# Patient Record
Sex: Female | Born: 1950 | Race: Black or African American | Hispanic: No | Smoking: Current every day smoker
Health system: Southern US, Community
[De-identification: ages and names within clinical notes are randomized; demographics above are authoritative.]

## PROBLEM LIST (undated history)

## (undated) DIAGNOSIS — K219 Gastro-esophageal reflux disease without esophagitis: Secondary | ICD-10-CM

## (undated) DIAGNOSIS — E785 Hyperlipidemia, unspecified: Secondary | ICD-10-CM

## (undated) DIAGNOSIS — Z720 Tobacco use: Secondary | ICD-10-CM

## (undated) DIAGNOSIS — F419 Anxiety disorder, unspecified: Secondary | ICD-10-CM

## (undated) DIAGNOSIS — H579 Unspecified disorder of eye and adnexa: Secondary | ICD-10-CM

## (undated) DIAGNOSIS — J449 Chronic obstructive pulmonary disease, unspecified: Secondary | ICD-10-CM

## (undated) DIAGNOSIS — J45909 Unspecified asthma, uncomplicated: Secondary | ICD-10-CM

## (undated) DIAGNOSIS — M1990 Unspecified osteoarthritis, unspecified site: Secondary | ICD-10-CM

## (undated) DIAGNOSIS — I10 Essential (primary) hypertension: Secondary | ICD-10-CM

## (undated) HISTORY — PX: CARPAL TUNNEL RELEASE: SHX101

## (undated) HISTORY — PX: ABDOMINAL HYSTERECTOMY: SHX81

---

## 2004-02-10 ENCOUNTER — Ambulatory Visit: Payer: Self-pay | Admitting: Family Medicine

## 2004-03-24 ENCOUNTER — Emergency Department: Payer: Self-pay | Admitting: General Practice

## 2005-12-06 ENCOUNTER — Emergency Department: Payer: Self-pay | Admitting: Emergency Medicine

## 2006-05-03 ENCOUNTER — Emergency Department: Payer: Self-pay | Admitting: Internal Medicine

## 2008-11-04 ENCOUNTER — Ambulatory Visit: Payer: Self-pay | Admitting: Orthopedic Surgery

## 2008-11-10 ENCOUNTER — Ambulatory Visit: Payer: Self-pay | Admitting: Orthopedic Surgery

## 2013-12-07 ENCOUNTER — Encounter: Payer: Self-pay | Admitting: Internal Medicine

## 2013-12-21 ENCOUNTER — Encounter: Payer: Self-pay | Admitting: Internal Medicine

## 2014-12-17 ENCOUNTER — Emergency Department
Admission: EM | Admit: 2014-12-17 | Discharge: 2014-12-17 | Disposition: A | Discharge status: Home / Self Care | Payer: Medicare HMO | Attending: Emergency Medicine | Admitting: Emergency Medicine

## 2014-12-17 DIAGNOSIS — R05 Cough: Secondary | ICD-10-CM | POA: Diagnosis present

## 2014-12-17 DIAGNOSIS — F17210 Nicotine dependence, cigarettes, uncomplicated: Secondary | ICD-10-CM | POA: Insufficient documentation

## 2014-12-17 DIAGNOSIS — I10 Essential (primary) hypertension: Secondary | ICD-10-CM | POA: Insufficient documentation

## 2014-12-17 DIAGNOSIS — J209 Acute bronchitis, unspecified: Secondary | ICD-10-CM | POA: Diagnosis not present

## 2014-12-17 DIAGNOSIS — J40 Bronchitis, not specified as acute or chronic: Secondary | ICD-10-CM

## 2014-12-17 HISTORY — DX: Gastro-esophageal reflux disease without esophagitis: K21.9

## 2014-12-17 HISTORY — DX: Unspecified disorder of eye and adnexa: H57.9

## 2014-12-17 HISTORY — DX: Unspecified osteoarthritis, unspecified site: M19.90

## 2014-12-17 MED ORDER — PREDNISONE 10 MG PO TABS: 10.0000 mg | ORAL_TABLET | ORAL | Status: DC

## 2014-12-17 MED ORDER — ALBUTEROL SULFATE HFA 108 (90 BASE) MCG/ACT IN AERS: 2.0000 | INHALATION_SPRAY | RESPIRATORY_TRACT | Status: DC | PRN

## 2014-12-17 MED ORDER — BENZONATATE 200 MG PO CAPS: 200.0000 mg | ORAL_CAPSULE | Freq: Three times a day (TID) | ORAL | Status: DC | PRN

## 2014-12-17 MED ORDER — AZITHROMYCIN 250 MG PO TABS: ORAL_TABLET | ORAL | Status: DC

## 2014-12-17 NOTE — ED Notes (Signed)
Pt with cough onset a week ago, progressively worse. Last 2 nights with minimal sleep d/t cough. Congested but non-productive. Soreness to chest when coughing

## 2014-12-17 NOTE — ED Provider Notes (Signed)
[facility] Emergency Department Provider Note  ____________________________________________  Time seen: Approximately 3:26 PM  I have reviewed the triage vital signs and the nursing notes.   HISTORY  Chief Complaint Cough    HPI Deborah Montgomery is a 64 y.o. female presents emergency department complaining of a cough for week. She states that initially she had some minor nasal congestion, scratchy throat, and light cough. She states that she was improving about day 3 and then the symptoms have increased over the intervening period. It is tried over-the-counter medications with no relief. States that her chest feels tight when coughing.   Past Medical History  Diagnosis Date  . Hypertension   . Arthritis   . GERD (gastroesophageal reflux disease)   . Eye problem   . High cholesterol     There are no active problems to display for this patient.   Past Surgical History  Procedure Laterality Date  . Abdominal hysterectomy    . Carpal tunnel release      Current Outpatient Rx  Name  Route  Sig  Dispense  Refill  . albuterol (PROVENTIL HFA;VENTOLIN HFA) 108 (90 BASE) MCG/ACT inhaler   Inhalation   Inhale 2 puffs into the lungs every 4 (four) hours as needed for wheezing or shortness of breath.   1 Inhaler   0   . azithromycin (ZITHROMAX Z-PAK) 250 MG tablet      Take 2 tablets (500 mg) on  Day 1,  followed by 1 tablet (250 mg) once daily on Days 2 through 5.   6 each   0   . benzonatate (TESSALON) 200 MG capsule   Oral   Take 1 capsule (200 mg total) by mouth 3 (three) times daily as needed for cough.   21 capsule   0   . predniSONE (DELTASONE) 10 MG tablet   Oral   Take 1 tablet (10 mg total) by mouth as directed.   21 tablet   0     Take on a daily basis of 6, 5, 4, 3, 2, 1     Allergies Review of patient's allergies indicates no known allergies.  History reviewed. No pertinent family history.  Social History Social History   Substance Use Topics  . Smoking status: Current Every Day Smoker    Types: Cigarettes  . Smokeless tobacco: None  . Alcohol Use: No    Review of Systems Constitutional: No fever/chills Eyes: No visual changes. ENT: No sore throat. Cardiovascular: Denies chest pain. Respiratory: Denies shortness of breath. Endorses nonproductive cough. Gastrointestinal: No abdominal pain.  No nausea, no vomiting.  No diarrhea.  No constipation. Genitourinary: Negative for dysuria. Musculoskeletal: Negative for back pain. Skin: Negative for rash. Neurological: Negative for headaches, focal weakness or numbness.  10-point ROS otherwise negative.  ____________________________________________   PHYSICAL EXAM:  VITAL SIGNS: ED Triage Vitals  Enc Vitals Group     BP 12/17/14 1441 145/85 mmHg     Pulse Rate 12/17/14 1441 83     Resp --      Temp 12/17/14 1441 98.4 F (36.9 C)     Temp Source 12/17/14 1441 Oral     SpO2 12/17/14 1441 97 %     Weight 12/17/14 1441 210 lb (95.255 kg)     Height 12/17/14 1441 5\' 5"  (1.651 m)     Head Cir --      Peak Flow --      Pain Score 12/17/14 1442 5  Pain Loc --      Pain Edu? --      Excl. in GC? --     Constitutional: Alert and oriented. Well appearing and in no acute distress. Eyes: Conjunctivae are normal. PERRL. EOMI. Head: Atraumatic. Nose: No congestion/rhinnorhea. Mouth/Throat: Mucous membranes are moist.  Oropharynx non-erythematous. Neck: No stridor.   Cardiovascular: Normal rate, regular rhythm. Grossly normal heart sounds.  Good peripheral circulation. Respiratory: Normal respiratory effort.  No retractions. Scattered wheezing and crackles noted bilateral lung fields. No rhonchi noted. Good air entry into the bases. No absent breath sounds. Gastrointestinal: Soft and nontender. No distention. No abdominal bruits. No CVA tenderness. Musculoskeletal: No lower extremity tenderness nor edema.  No joint effusions. Neurologic:  Normal  speech and language. No gross focal neurologic deficits are appreciated. No gait instability. Skin:  Skin is warm, dry and intact. No rash noted. Psychiatric: Mood and affect are normal. Speech and behavior are normal.  ____________________________________________   LABS (all labs ordered are listed, but only abnormal results are displayed)  Labs Reviewed - No data to display ____________________________________________  EKG   ____________________________________________  RADIOLOGY   ____________________________________________   PROCEDURES  Procedure(s) performed: None  Critical Care performed: No  ____________________________________________   INITIAL IMPRESSION / ASSESSMENT AND PLAN / ED COURSE  Pertinent labs & imaging results that were available during my care of the patient were reviewed by me and considered in my medical decision making (see chart for details).  The patient's history, symptoms, physical exam are taken into consideration for diagnosis. Patient's history is consistent with bronchitis that is likely bacterial in nature. I advised patient of findings and diagnosis and she verbalizes understanding of same. Patient will be discharged home with antibiotics, steroids, albuterol, and Tessalon Perles. Patient verbalizes understanding of diagnosis and treatment plan and verbalizes compliance with same. ____________________________________________   FINAL CLINICAL IMPRESSION(S) / ED DIAGNOSES  Final diagnoses:  Bronchitis      Racheal Patches, PA-C 12/17/14 1621  Arnaldo Natal, MD 12/18/14 [phone]

## 2014-12-17 NOTE — Discharge Instructions (Signed)

## 2015-05-10 ENCOUNTER — Encounter: Payer: Self-pay | Admitting: Emergency Medicine

## 2015-05-10 ENCOUNTER — Emergency Department: Payer: Medicare HMO

## 2015-05-10 ENCOUNTER — Emergency Department
Admission: EM | Admit: 2015-05-10 | Discharge: 2015-05-10 | Disposition: A | Discharge status: Home / Self Care | Payer: Medicare HMO | Attending: Emergency Medicine | Admitting: Emergency Medicine

## 2015-05-10 DIAGNOSIS — Z90710 Acquired absence of both cervix and uterus: Secondary | ICD-10-CM | POA: Diagnosis not present

## 2015-05-10 DIAGNOSIS — I10 Essential (primary) hypertension: Secondary | ICD-10-CM | POA: Diagnosis not present

## 2015-05-10 DIAGNOSIS — M1990 Unspecified osteoarthritis, unspecified site: Secondary | ICD-10-CM | POA: Diagnosis not present

## 2015-05-10 DIAGNOSIS — F17210 Nicotine dependence, cigarettes, uncomplicated: Secondary | ICD-10-CM | POA: Insufficient documentation

## 2015-05-10 DIAGNOSIS — Z79899 Other long term (current) drug therapy: Secondary | ICD-10-CM | POA: Diagnosis not present

## 2015-05-10 DIAGNOSIS — J209 Acute bronchitis, unspecified: Secondary | ICD-10-CM | POA: Diagnosis not present

## 2015-05-10 DIAGNOSIS — R05 Cough: Secondary | ICD-10-CM | POA: Diagnosis present

## 2015-05-10 HISTORY — DX: Anxiety disorder, unspecified: F41.9

## 2015-05-10 LAB — CBC WITH DIFFERENTIAL/PLATELET
BASOS ABS: 0.1 10*3/uL (ref 0–0.1)
BASOS PCT: 1 %
EOS ABS: 1.2 10*3/uL — AB (ref 0–0.7)
Eosinophils Relative: 8 %
HEMATOCRIT: 40.8 % (ref 35.0–47.0)
HEMOGLOBIN: 13.6 g/dL (ref 12.0–16.0)
Lymphocytes Relative: 20 %
Lymphs Abs: 3 10*3/uL (ref 1.0–3.6)
MCH: 27.5 pg (ref 26.0–34.0)
MCHC: 33.2 g/dL (ref 32.0–36.0)
MCV: 83 fL (ref 80.0–100.0)
Monocytes Absolute: 0.7 10*3/uL (ref 0.2–0.9)
Monocytes Relative: 5 %
NEUTROS ABS: 10.1 10*3/uL — AB (ref 1.4–6.5)
NEUTROS PCT: 66 %
Platelets: 365 10*3/uL (ref 150–440)
RBC: 4.92 MIL/uL (ref 3.80–5.20)
RDW: 12.7 % (ref 11.5–14.5)
WBC: 15.1 10*3/uL — AB (ref 3.6–11.0)

## 2015-05-10 LAB — COMPREHENSIVE METABOLIC PANEL
ALBUMIN: 4.5 g/dL (ref 3.5–5.0)
ALK PHOS: 99 U/L (ref 38–126)
ALT: 14 U/L (ref 14–54)
ANION GAP: 7 (ref 5–15)
AST: 21 U/L (ref 15–41)
BILIRUBIN TOTAL: 0.3 mg/dL (ref 0.3–1.2)
BUN: 10 mg/dL (ref 6–20)
CALCIUM: 9.2 mg/dL (ref 8.9–10.3)
CO2: 26 mmol/L (ref 22–32)
CREATININE: 0.62 mg/dL (ref 0.44–1.00)
Chloride: 103 mmol/L (ref 101–111)
GFR calc Af Amer: >60 mL/min (ref >60)
GFR calc non Af Amer: >60 mL/min (ref >60)
GLUCOSE: 111 mg/dL — AB (ref 65–99)
Potassium: 2.9 mmol/L — CL (ref 3.5–5.1)
SODIUM: 136 mmol/L (ref 135–145)
Total Protein: 7.8 g/dL (ref 6.5–8.1)

## 2015-05-10 LAB — BRAIN NATRIURETIC PEPTIDE: B Natriuretic Peptide: 11 pg/mL (ref 0.0–100.0)

## 2015-05-10 MED ORDER — BENZONATATE 100 MG PO CAPS
200.0000 mg | ORAL_CAPSULE | Freq: Once | ORAL | Status: AC
  Administered 2015-05-10: 200 mg via ORAL
  Filled 2015-05-10: qty 2

## 2015-05-10 MED ORDER — AZITHROMYCIN 500 MG PO TABS
500.0000 mg | ORAL_TABLET | Freq: Once | ORAL | Status: AC
Start: 2015-05-10 — End: 2015-05-10
  Administered 2015-05-10: 500 mg via ORAL
  Filled 2015-05-10: qty 1

## 2015-05-10 MED ORDER — PREDNISONE 20 MG PO TABS
60.0000 mg | ORAL_TABLET | Freq: Once | ORAL | Status: AC
  Administered 2015-05-10: 60 mg via ORAL
  Filled 2015-05-10: qty 3

## 2015-05-10 MED ORDER — PREDNISONE 20 MG PO TABS: 60.0000 mg | ORAL_TABLET | Freq: Every day | ORAL | Status: AC

## 2015-05-10 MED ORDER — BENZONATATE 200 MG PO CAPS
200.0000 mg | ORAL_CAPSULE | Freq: Three times a day (TID) | ORAL | Status: DC | PRN
Start: 2015-05-10 — End: 2015-06-30

## 2015-05-10 MED ORDER — POTASSIUM CHLORIDE CRYS ER 20 MEQ PO TBCR
40.0000 meq | EXTENDED_RELEASE_TABLET | Freq: Once | ORAL | Status: AC
  Administered 2015-05-10: 40 meq via ORAL
  Filled 2015-05-10: qty 2

## 2015-05-10 MED ORDER — AZITHROMYCIN 500 MG PO TABS: 500.0000 mg | ORAL_TABLET | Freq: Every day | ORAL | Status: AC

## 2015-05-10 MED ORDER — IPRATROPIUM-ALBUTEROL 0.5-2.5 (3) MG/3ML IN SOLN
3.0000 mL | Freq: Once | RESPIRATORY_TRACT | Status: AC
  Administered 2015-05-10: 3 mL via RESPIRATORY_TRACT

## 2015-05-10 MED ORDER — IPRATROPIUM-ALBUTEROL 0.5-2.5 (3) MG/3ML IN SOLN
RESPIRATORY_TRACT | Status: AC
  Administered 2015-05-10: 3 mL via RESPIRATORY_TRACT
  Filled 2015-05-10: qty 3

## 2015-05-10 NOTE — ED Notes (Signed)

## 2015-05-10 NOTE — Discharge Instructions (Signed)

## 2015-05-10 NOTE — ED Notes (Addendum)
Pt presents to ED with frequent non-productive cough since Saturday. Pt states she isn't sure if its all the pollen but she cant even walk outside to her car with out coughing and feeling short of breath. Nausea but denies vomiting. Denies congestion or fever. Pt currently being tx for bronchitis and was given inhaler; states it is not helping. No chest xray was performed. No increased work of breathing or acte distress noted at this time.

## 2015-05-10 NOTE — ED Notes (Signed)
Pt presents to ED with c/o cough and shortness of breath since Saturday. Pt reports has used inhaler which relieves symptoms for but states symptoms return quickly. Pt reports chest tightness when coughing and dyspnea with exertion. Pt has history of bronchitis. States she believes cough is from the pollen. Pt speaking in complete sentences. Denies chest pain, dizziness, fevers, or weakness.

## 2015-05-10 NOTE — ED Provider Notes (Signed)
[facility] Emergency Department Provider Note  ____________________________________________  Time seen: 1:45 AM  I have reviewed the triage vital signs and the nursing notes.   HISTORY  Chief Complaint Cough      HPI Deborah Montgomery is a 65 y.o. female with history of hypertension hyperlipidemia presents with nonproductive cough 4 days. Patient denies any fever no chest pain. Patient's temperature on presentation 98.4. Patient admits to previous history of bronchitis.     Past Medical History  Diagnosis Date  . Hypertension   . Arthritis   . GERD (gastroesophageal reflux disease)   . Eye problem   . High cholesterol   . Anxiety     There are no active problems to display for this patient.   Past Surgical History  Procedure Laterality Date  . Abdominal hysterectomy    . Carpal tunnel release      Current Outpatient Rx  Name  Route  Sig  Dispense  Refill  . albuterol (PROVENTIL HFA;VENTOLIN HFA) 108 (90 BASE) MCG/ACT inhaler   Inhalation   Inhale 2 puffs into the lungs every 4 (four) hours as needed for wheezing or shortness of breath.   1 Inhaler   0   . azithromycin (ZITHROMAX Z-PAK) 250 MG tablet      Take 2 tablets (500 mg) on  Day 1,  followed by 1 tablet (250 mg) once daily on Days 2 through 5.   6 each   0   . azithromycin (ZITHROMAX) 500 MG tablet   Oral   Take 1 tablet (500 mg total) by mouth daily.   3 tablet   0   . benzonatate (TESSALON) 200 MG capsule   Oral   Take 1 capsule (200 mg total) by mouth 3 (three) times daily as needed for cough.   21 capsule   0   . predniSONE (DELTASONE) 10 MG tablet   Oral   Take 1 tablet (10 mg total) by mouth as directed.   21 tablet   0     Take on a daily basis of 6, 5, 4, 3, 2, 1   . predniSONE (DELTASONE) 20 MG tablet   Oral   Take 3 tablets (60 mg total) by mouth daily with breakfast.   15 tablet   0     Allergies No known drug allergies No family history on  file.  Social History Social History  Substance Use Topics  . Smoking status: Current Every Day Smoker -- 0.00 packs/day    Types: Cigarettes  . Smokeless tobacco: None  . Alcohol Use: No    Review of Systems  Constitutional: Negative for fever. Eyes: Negative for visual changes. ENT: Negative for sore throat. Cardiovascular: Negative for chest pain. Respiratory: Negative for shortness of breath.Positive for cough  Gastrointestinal: Negative for abdominal pain, vomiting and diarrhea. Genitourinary: Negative for dysuria. Musculoskeletal: Negative for back pain. Skin: Negative for rash. Neurological: Negative for headaches, focal weakness or numbness.   10-point ROS otherwise negative.  ____________________________________________   PHYSICAL EXAM:  VITAL SIGNS: ED Triage Vitals  Enc Vitals Group     BP 05/10/15 0122 159/92 mmHg     Pulse Rate 05/10/15 0122 98     Resp 05/10/15 0122 20     Temp 05/10/15 0122 98.4 F (36.9 C)     Temp Source 05/10/15 0122 Oral     SpO2 05/10/15 0122 96 %     Weight 05/10/15 0122 210 lb (95.255 kg)  Height 05/10/15 0122 5\' 5"  (1.651 m)     Head Cir --      Peak Flow --      Pain Score 05/10/15 0122 0     Pain Loc --      Pain Edu? --      Excl. in GC? --      Constitutional: Alert and oriented. Well appearing and in no distress. Eyes: Conjunctivae are normal. PERRL. Normal extraocular movements. ENT   Head: Normocephalic and atraumatic.   Nose: No congestion/rhinnorhea.   Mouth/Throat: Mucous membranes are moist.   Neck: No stridor. Hematological/Lymphatic/Immunilogical: No cervical lymphadenopathy. Cardiovascular: Normal rate, regular rhythm. Normal and symmetric distal pulses are present in all extremities. No murmurs, rubs, or gallops. Respiratory: Normal respiratory effort without tachypnea nor retractions. Breath sounds are clear and equal bilaterally. No wheezes/rales/rhonchi. Gastrointestinal: Soft and  nontender. No distention. There is no CVA tenderness. Genitourinary: deferred Musculoskeletal: Nontender with normal range of motion in all extremities. No joint effusions.  No lower extremity tenderness nor edema. Neurologic:  Normal speech and language. No gross focal neurologic deficits are appreciated. Speech is normal.  Skin:  Skin is warm, dry and intact. No rash noted. Psychiatric: Mood and affect are normal. Speech and behavior are normal. Patient exhibits appropriate insight and judgment.  ____________________________________________    LABS (pertinent positives/negatives)  Labs Reviewed  CBC WITH DIFFERENTIAL/PLATELET - Abnormal; Notable for the following:    WBC 15.1 (*)    Neutro Abs 10.1 (*)    Eosinophils Absolute 1.2 (*)    All other components within normal limits  COMPREHENSIVE METABOLIC PANEL - Abnormal; Notable for the following:    Potassium 2.9 (*)    Glucose, Bld 111 (*)    All other components within normal limits  BRAIN NATRIURETIC PEPTIDE    ED ECG REPORT I, [place] N Tjuana Vickrey, the attending physician, personally viewed and interpreted this ECG.   Date: 05/10/2015  EKG Time: 1:33 AM  Rate: 87  Rhythm: Normal sinus rhythm  Axis: Normal  Intervals: Normal  ST&T Change: None    RADIOLOGY   DG Chest 2 View (Final result) Result time: 05/10/15 01:55:17   Final result by Rad Results In Interface (05/10/15 01:55:17)   Narrative:   CLINICAL DATA: Cough and shortness of breath since Saturday. History of hypertension. Recent bronchitis.  EXAM: CHEST 2 VIEW  COMPARISON: None.  FINDINGS: The heart size and mediastinal contours are within normal limits. Both lungs are clear. The visualized skeletal structures are unremarkable.  IMPRESSION: No active cardiopulmonary disease.   Electronically Signed By: Burman NievesWilliam Stevens M.D. On: 05/10/2015 01:55             INITIAL IMPRESSION / ASSESSMENT AND PLAN / ED COURSE  Pertinent labs  & imaging results that were available during my care of the patient were reviewed by me and considered in my medical decision making (see chart for details).  She received DuoNeb prednisone and Tessalon Perles in the emergency department with "feeling much better". Patient be prescribed same for home  ____________________________________________   FINAL CLINICAL IMPRESSION(S) / ED DIAGNOSES  Final diagnoses:  Acute bronchitis, unspecified organism      Darci Currentandolph N Etoile Looman, MD 05/10/15 [phone]

## 2015-06-28 ENCOUNTER — Encounter: Payer: Self-pay | Admitting: Emergency Medicine

## 2015-06-28 ENCOUNTER — Emergency Department: Payer: Medicare HMO

## 2015-06-28 ENCOUNTER — Inpatient Hospital Stay
Admission: EM | Admit: 2015-06-28 | Discharge: 2015-06-30 | DRG: 191 | Disposition: A | Discharge status: Home / Self Care | Payer: Medicare HMO | Attending: Internal Medicine | Admitting: Internal Medicine

## 2015-06-28 DIAGNOSIS — Z79899 Other long term (current) drug therapy: Secondary | ICD-10-CM | POA: Diagnosis not present

## 2015-06-28 DIAGNOSIS — Z720 Tobacco use: Secondary | ICD-10-CM | POA: Diagnosis present

## 2015-06-28 DIAGNOSIS — F17210 Nicotine dependence, cigarettes, uncomplicated: Secondary | ICD-10-CM | POA: Diagnosis present

## 2015-06-28 DIAGNOSIS — Z7951 Long term (current) use of inhaled steroids: Secondary | ICD-10-CM

## 2015-06-28 DIAGNOSIS — I214 Non-ST elevation (NSTEMI) myocardial infarction: Secondary | ICD-10-CM

## 2015-06-28 DIAGNOSIS — K219 Gastro-esophageal reflux disease without esophagitis: Secondary | ICD-10-CM | POA: Diagnosis present

## 2015-06-28 DIAGNOSIS — E669 Obesity, unspecified: Secondary | ICD-10-CM | POA: Diagnosis present

## 2015-06-28 DIAGNOSIS — J441 Chronic obstructive pulmonary disease with (acute) exacerbation: Secondary | ICD-10-CM | POA: Diagnosis present

## 2015-06-28 DIAGNOSIS — E785 Hyperlipidemia, unspecified: Secondary | ICD-10-CM | POA: Diagnosis present

## 2015-06-28 DIAGNOSIS — R0602 Shortness of breath: Secondary | ICD-10-CM

## 2015-06-28 DIAGNOSIS — Z7952 Long term (current) use of systemic steroids: Secondary | ICD-10-CM | POA: Diagnosis not present

## 2015-06-28 DIAGNOSIS — I248 Other forms of acute ischemic heart disease: Secondary | ICD-10-CM | POA: Diagnosis present

## 2015-06-28 DIAGNOSIS — R7989 Other specified abnormal findings of blood chemistry: Secondary | ICD-10-CM | POA: Diagnosis not present

## 2015-06-28 DIAGNOSIS — M1990 Unspecified osteoarthritis, unspecified site: Secondary | ICD-10-CM | POA: Diagnosis present

## 2015-06-28 DIAGNOSIS — E7800 Pure hypercholesterolemia, unspecified: Secondary | ICD-10-CM | POA: Diagnosis present

## 2015-06-28 DIAGNOSIS — F419 Anxiety disorder, unspecified: Secondary | ICD-10-CM | POA: Diagnosis present

## 2015-06-28 DIAGNOSIS — I10 Essential (primary) hypertension: Secondary | ICD-10-CM | POA: Diagnosis present

## 2015-06-28 DIAGNOSIS — Z6835 Body mass index (BMI) 35.0-35.9, adult: Secondary | ICD-10-CM | POA: Diagnosis not present

## 2015-06-28 HISTORY — DX: Chronic obstructive pulmonary disease, unspecified: J44.9

## 2015-06-28 HISTORY — DX: Tobacco use: Z72.0

## 2015-06-28 HISTORY — DX: Essential (primary) hypertension: I10

## 2015-06-28 HISTORY — DX: Hyperlipidemia, unspecified: E78.5

## 2015-06-28 HISTORY — DX: Unspecified asthma, uncomplicated: J45.909

## 2015-06-28 LAB — CBC WITH DIFFERENTIAL/PLATELET
Basophils Absolute: 0.2 10*3/uL — ABNORMAL HIGH (ref 0–0.1)
Basophils Relative: 1 %
EOS ABS: 1.6 10*3/uL — AB (ref 0–0.7)
HCT: 42.5 % (ref 35.0–47.0)
HEMOGLOBIN: 13.7 g/dL (ref 12.0–16.0)
LYMPHS ABS: 4 10*3/uL — AB (ref 1.0–3.6)
Lymphocytes Relative: 27 %
MCH: 27.6 pg (ref 26.0–34.0)
MCHC: 32.3 g/dL (ref 32.0–36.0)
MCV: 85.4 fL (ref 80.0–100.0)
Monocytes Absolute: 0.6 10*3/uL (ref 0.2–0.9)
Neutro Abs: 8.6 10*3/uL — ABNORMAL HIGH (ref 1.4–6.5)
Neutrophils Relative %: 57 %
PLATELETS: 340 10*3/uL (ref 150–440)
RBC: 4.97 MIL/uL (ref 3.80–5.20)
RDW: 13.4 % (ref 11.5–14.5)
WBC: 15 10*3/uL — ABNORMAL HIGH (ref 3.6–11.0)

## 2015-06-28 LAB — BASIC METABOLIC PANEL
Anion gap: 10 (ref 5–15)
BUN: 11 mg/dL (ref 6–20)
CHLORIDE: 103 mmol/L (ref 101–111)
CO2: 26 mmol/L (ref 22–32)
CREATININE: 0.62 mg/dL (ref 0.44–1.00)
Calcium: 9.3 mg/dL (ref 8.9–10.3)
GFR calc Af Amer: >60 mL/min (ref >60)
GFR calc non Af Amer: >60 mL/min (ref >60)
Glucose, Bld: 109 mg/dL — ABNORMAL HIGH (ref 65–99)
POTASSIUM: 3.4 mmol/L — AB (ref 3.5–5.1)
Sodium: 139 mmol/L (ref 135–145)

## 2015-06-28 LAB — TROPONIN I: Troponin I: 0.04 ng/mL — ABNORMAL HIGH (ref <0.031)

## 2015-06-28 MED ORDER — METHYLPREDNISOLONE SODIUM SUCC 125 MG IJ SOLR
60.0000 mg | Freq: Three times a day (TID) | INTRAMUSCULAR | Status: DC
  Administered 2015-06-29 (×2): 60 mg via INTRAVENOUS
  Filled 2015-06-28 (×2): qty 2

## 2015-06-28 MED ORDER — NICOTINE 14 MG/24HR TD PT24
14.0000 mg | MEDICATED_PATCH | Freq: Every day | TRANSDERMAL | Status: DC
  Administered 2015-06-29: 14 mg via TRANSDERMAL
  Filled 2015-06-28: qty 1

## 2015-06-28 MED ORDER — ONDANSETRON HCL 4 MG PO TABS: 4.0000 mg | ORAL_TABLET | Freq: Four times a day (QID) | ORAL | Status: DC | PRN

## 2015-06-28 MED ORDER — HYDROCHLOROTHIAZIDE 25 MG PO TABS
25.0000 mg | ORAL_TABLET | Freq: Every day | ORAL | Status: DC
  Administered 2015-06-29 – 2015-06-30 (×2): 25 mg via ORAL
  Filled 2015-06-28 (×2): qty 1

## 2015-06-28 MED ORDER — IPRATROPIUM-ALBUTEROL 0.5-2.5 (3) MG/3ML IN SOLN: 3.0000 mL | RESPIRATORY_TRACT | Status: DC | PRN

## 2015-06-28 MED ORDER — METHYLPREDNISOLONE SODIUM SUCC 125 MG IJ SOLR
125.0000 mg | Freq: Once | INTRAMUSCULAR | Status: AC
  Administered 2015-06-28: 125 mg via INTRAVENOUS
  Filled 2015-06-28: qty 2

## 2015-06-28 MED ORDER — BENZONATATE 100 MG PO CAPS
100.0000 mg | ORAL_CAPSULE | Freq: Three times a day (TID) | ORAL | Status: DC | PRN
  Administered 2015-06-29 (×2): 100 mg via ORAL
  Filled 2015-06-28 (×2): qty 1

## 2015-06-28 MED ORDER — IPRATROPIUM-ALBUTEROL 0.5-2.5 (3) MG/3ML IN SOLN
3.0000 mL | Freq: Once | RESPIRATORY_TRACT | Status: AC
  Administered 2015-06-28: 3 mL via RESPIRATORY_TRACT
  Filled 2015-06-28: qty 3

## 2015-06-28 MED ORDER — SENNOSIDES-DOCUSATE SODIUM 8.6-50 MG PO TABS: 1.0000 | ORAL_TABLET | Freq: Every evening | ORAL | Status: DC | PRN

## 2015-06-28 MED ORDER — ATORVASTATIN CALCIUM 20 MG PO TABS
40.0000 mg | ORAL_TABLET | Freq: Every day | ORAL | Status: DC
  Administered 2015-06-29 (×2): 40 mg via ORAL
  Filled 2015-06-28 (×2): qty 2

## 2015-06-28 MED ORDER — LATANOPROST 0.005 % OP SOLN
1.0000 [drp] | Freq: Every day | OPHTHALMIC | Status: DC
  Administered 2015-06-29: 1 [drp] via OPHTHALMIC
  Filled 2015-06-28: qty 2.5

## 2015-06-28 MED ORDER — LOSARTAN POTASSIUM 50 MG PO TABS
25.0000 mg | ORAL_TABLET | Freq: Every day | ORAL | Status: DC
  Administered 2015-06-29 – 2015-06-30 (×2): 25 mg via ORAL
  Filled 2015-06-28 (×2): qty 1

## 2015-06-28 MED ORDER — ONDANSETRON HCL 4 MG/2ML IJ SOLN: 4.0000 mg | Freq: Four times a day (QID) | INTRAMUSCULAR | Status: DC | PRN

## 2015-06-28 MED ORDER — ACETAMINOPHEN 325 MG PO TABS: 650.0000 mg | ORAL_TABLET | Freq: Four times a day (QID) | ORAL | Status: DC | PRN

## 2015-06-28 MED ORDER — ASPIRIN EC 81 MG PO TBEC: 81.0000 mg | DELAYED_RELEASE_TABLET | Freq: Every day | ORAL | Status: DC

## 2015-06-28 MED ORDER — PANTOPRAZOLE SODIUM 40 MG PO TBEC
40.0000 mg | DELAYED_RELEASE_TABLET | Freq: Every day | ORAL | Status: DC
  Administered 2015-06-29 – 2015-06-30 (×2): 40 mg via ORAL
  Filled 2015-06-28 (×2): qty 1

## 2015-06-28 MED ORDER — ACETAMINOPHEN 650 MG RE SUPP: 650.0000 mg | Freq: Four times a day (QID) | RECTAL | Status: DC | PRN

## 2015-06-28 MED ORDER — ENOXAPARIN SODIUM 40 MG/0.4ML ~~LOC~~ SOLN
40.0000 mg | SUBCUTANEOUS | Status: DC
  Administered 2015-06-29: 40 mg via SUBCUTANEOUS
  Filled 2015-06-28: qty 0.4

## 2015-06-28 NOTE — ED Provider Notes (Signed)
[facility] Emergency Department Provider Note   ____________________________________________  Time seen: Approximately 7:35 PM I have reviewed the triage vital signs and the triage nursing note.  HISTORY  Chief Complaint Shortness of Breath   Historian Patient  HPI Deborah Montgomery is a 65 y.o. female arrived by EMS with a complaint of shortness of breath, started this morning when she woke up which she describes as wheezing. Patient states that she was diagnosed with "asthma" within the past few months when she came to the emergency department and then followed up with her primary care doctor down at [facility]. She states that she is on albuterol inhaler and Advair now. No chest pain or palpitations but she has had some chest tightness associated with shortness of breath. Dry cough without any sputum production. No fevers.  Patient received DuoNeb treatment in route with EMS, no soluMedrol.  Symptoms are moderate and improved from when she called EMS.    Past Medical History  Diagnosis Date  . Hypertension   . Arthritis   . GERD (gastroesophageal reflux disease)   . Eye problem   . High cholesterol   . Anxiety     There are no active problems to display for this patient.   Past Surgical History  Procedure Laterality Date  . Abdominal hysterectomy    . Carpal tunnel release      Current Outpatient Rx  Name  Route  Sig  Dispense  Refill  . albuterol (PROVENTIL HFA;VENTOLIN HFA) 108 (90 BASE) MCG/ACT inhaler   Inhalation   Inhale 2 puffs into the lungs every 4 (four) hours as needed for wheezing or shortness of breath.   1 Inhaler   0   . azithromycin (ZITHROMAX Z-PAK) 250 MG tablet      Take 2 tablets (500 mg) on  Day 1,  followed by 1 tablet (250 mg) once daily on Days 2 through 5.   6 each   0   . benzonatate (TESSALON) 200 MG capsule   Oral   Take 1 capsule (200 mg total) by mouth 3 (three) times daily as needed for cough.   21 capsule  0   . predniSONE (DELTASONE) 10 MG tablet   Oral   Take 1 tablet (10 mg total) by mouth as directed.   21 tablet   0     Take on a daily basis of 6, 5, 4, 3, 2, 1     Allergies Review of patient's allergies indicates no known allergies.  History reviewed. No pertinent family history.  Social History Social History  Substance Use Topics  . Smoking status: Current Every Day Smoker -- 0.00 packs/day    Types: Cigarettes  . Smokeless tobacco: None  . Alcohol Use: No    Review of Systems  Constitutional: Negative for fever. Eyes: Negative for visual changes. ENT: Negative for sore throat. Cardiovascular: Negative for chest pain. Respiratory: Positive for shortness of breath. Gastrointestinal: Negative for abdominal pain, vomiting and diarrhea. Genitourinary: Negative for dysuria. Musculoskeletal: Negative for back pain. Skin: Negative for rash. Neurological: Negative for headache. 10 point Review of Systems otherwise negative ____________________________________________   PHYSICAL EXAM:  VITAL SIGNS: ED Triage Vitals  Enc Vitals Group     BP 06/28/15 1937 142/85 mmHg     Pulse Rate 06/28/15 1937 100     Resp 06/28/15 1937 20     Temp 06/28/15 1941 98.5 F (36.9 C)     Temp src --  SpO2 06/28/15 1937 100 %     Weight 06/28/15 1937 210 lb (95.255 kg)     Height 06/28/15 1937  (1.626 m)     Head Cir --      Peak Flow --      Pain Score --      Pain Loc --      Pain Edu? --      Excl. in GC? --      Constitutional: Alert and oriented. Well appearing and In mild distress for stress. HEENT   Head: Normocephalic and atraumatic.      Eyes: Conjunctivae are normal. PERRL. Normal extraocular movements.      Ears:         Nose: No congestion/rhinnorhea.   Mouth/Throat: Mucous membranes are moist.   Neck: No stridor. Cardiovascular/Chest: Normal rate, regular rhythm.  No murmurs, rubs, or gallops. Respiratory: Normal respiratory effort  without tachypnea nor retractions. Slightly breathless when talking. Moderate end expiratory wheezing all fields. No rhonchi. Gastrointestinal: Soft. No distention, no guarding, no rebound. Nontender.    Genitourinary/rectal:Deferred Musculoskeletal: Nontender with normal range of motion in all extremities. No joint effusions.  No lower extremity tenderness.  Trace lower extremity edema bilaterally without calf tenderness. Neurologic:  Normal speech and language. No gross or focal neurologic deficits are appreciated. Skin:  Skin is warm, dry and intact. No rash noted. Psychiatric: Mood and affect are normal. Speech and behavior are normal. Patient exhibits appropriate insight and judgment.  ____________________________________________   EKG I, Governor Rooks, MD, the attending physician have personally viewed and interpreted all ECGs.  90 bpm. Normal sinus rhythm. Narrow QRS. Normal axis. Normal ST and T-wave ____________________________________________  LABS (pertinent positives/negatives)  Labs Reviewed  BASIC METABOLIC PANEL - Abnormal; Notable for the following:    Potassium 3.4 (*)    Glucose, Bld 109 (*)    All other components within normal limits  TROPONIN I - Abnormal; Notable for the following:    Troponin I 0.04 (*)    All other components within normal limits  CBC WITH DIFFERENTIAL/PLATELET - Abnormal; Notable for the following:    WBC 15.0 (*)    Neutro Abs 8.6 (*)    Lymphs Abs 4.0 (*)    Eosinophils Absolute 1.6 (*)    Basophils Absolute 0.2 (*)    All other components within normal limits    ____________________________________________  RADIOLOGY All Xrays were viewed by me. Imaging interpreted by Radiologist.  Chest portable:  IMPRESSION: No edema or consolidation.  __________________________________________  PROCEDURES  Procedure(s) performed: None  Critical Care performed: None  ____________________________________________   ED COURSE /  ASSESSMENT AND PLAN  Pertinent labs & imaging results that were available during my care of the patient were reviewed by me and considered in my medical decision making (see chart for details).   Patient reportedly 90% oxygen saturation on room air. On 4 L nasal cannula when I found the patient she was 100%. I placed her back to 2 L nasal cannula. She is still wheezing a little bit breathless when she speaks after 1 DuoNeb treatment in route. I am going give her 2 more here as well as Solu-Medrol for COPD exacerbation.   Patient did have significant improvement, however when she got up to use the restroom she became severely dyspneic and had bad wheezing. Patient is not going to be able to be discharged when she is unable to take several steps without returning into severe wheezing.  She has maintain  oxygen saturation as low as 90% on room air, she is on 2 L nasal cannula around 96-98%.  Troponin minimally elevated at 0.04, but ongoing symptoms, and reassuring EKG, I am not suspicious of acute Reye's syndrome.  CONSULTATIONS:   Hospitalist for admission   Patient / Family / Caregiver informed of clinical course, medical decision-making process, and agree with plan.    ___________________________________________   FINAL CLINICAL IMPRESSION(S) / ED DIAGNOSES   Final diagnoses:  COPD exacerbation (HCC)              Note: This dictation was prepared with Nurse, children'sDragon dictation. Any transcriptional errors that result from this process are unintentional   Governor Rooksebecca Amra Shukla, MD 06/28/15 2120

## 2015-06-28 NOTE — ED Notes (Signed)
Admitting MD at bedside.

## 2015-06-28 NOTE — ED Notes (Signed)
Dr. Joneen Roachrosley paged regarding need of tele and patient placement. [facility] is unable to take tele patients at this time due to relocation (lower level).

## 2015-06-28 NOTE — ED Notes (Signed)
Patient ambulated to commode. Increased SOB. Patient unable to speak full sentences. Assisted back to bed. NSR on monitor. 2L [place] on, patient stating 99%.

## 2015-06-28 NOTE — H&P (Signed)
PCP:  [facility]  Chief Complaint:  Shortness of breath  HPI: This is a 65 year old female smoker who was diagnosed with COPD 3 weeks ago. She continues to smoke but she is now on the appropriate medications. She states all day her chest was tight and she could hardly breathe. Sheused her inhaler with no improvement. Her shortness of breath progress. She had a nonproductive cough and was wheezing. He became easily fatigued with walking.. She had no fevers or chills. She turned up the air in her apartment but her shortness of breath worsened. 911 was called and she was taken to the ER. In route she was given 2 nebulizer treatments, here in the ER she has been given 3 and she continues to wheeze. Oxygen saturation has been approximately 90% but she gets very shortness of breath with movement. The hospitalist have been requested admit.  Review of Systems:  The patient denies anorexia, fever, weight loss,, vision loss, decreased hearing, hoarseness, chest pain, syncope, dyspnea on exertion, peripheral edema, balance deficits, hemoptysis, abdominal pain, melena, hematochezia, severe indigestion/heartburn, hematuria, incontinence, genital sores, muscle weakness, suspicious skin lesions, transient blindness, difficulty walking, depression, unusual weight change, abnormal bleeding, enlarged lymph nodes, angioedema, and breast masses.  Past Medical History: Past Medical History  Diagnosis Date  . Hypertension   . Arthritis   . GERD (gastroesophageal reflux disease)   . Eye problem   . High cholesterol   . Anxiety    Past Surgical History  Procedure Laterality Date  . Abdominal hysterectomy    . Carpal tunnel release      Medications: Prior to Admission medications   Medication Sig Start Date End Date Taking? Authorizing Provider  albuterol (PROVENTIL HFA;VENTOLIN HFA) 108 (90 BASE) MCG/ACT inhaler Inhale 2 puffs into the lungs every 4 (four) hours as needed for wheezing or shortness of breath.  12/17/14  Yes Christiane Ha D Cuthriell, PA-C  atorvastatin (LIPITOR) 40 MG tablet Take 40 mg by mouth at bedtime.   Yes Historical Provider, MD  diclofenac sodium (VOLTAREN) 1 % GEL Apply 2 g topically 4 (four) times daily as needed (for shoulder pain).   Yes Historical Provider, MD  fluticasone (FLONASE) 50 MCG/ACT nasal spray Place 2 sprays into both nostrils daily as needed for rhinitis.   Yes Historical Provider, MD  hydrochlorothiazide (HYDRODIURIL) 25 MG tablet Take 25 mg by mouth daily.   Yes Historical Provider, MD  latanoprost (XALATAN) 0.005 % ophthalmic solution Place 1 drop into both eyes at bedtime.   Yes Historical Provider, MD  losartan (COZAAR) 25 MG tablet Take 25 mg by mouth daily.   Yes Historical Provider, MD  omeprazole (PRILOSEC) 20 MG capsule Take 20 mg by mouth daily.   Yes Historical Provider, MD  pseudoephedrine-dextromethorphan-guaifenesin (ROBITUSSIN-PE) 30-10-100 MG/5ML solution Take 10 mLs by mouth 4 (four) times daily as needed for cough.   Yes Historical Provider, MD  tiotropium (SPIRIVA) 18 MCG inhalation capsule Place 18 mcg into inhaler and inhale daily.   Yes Historical Provider, MD  benzonatate (TESSALON) 200 MG capsule Take 1 capsule (200 mg total) by mouth 3 (three) times daily as needed for cough. Patient not taking: Reported on 06/28/2015 05/10/15   Darci Current, MD    Allergies:  No Known Allergies  Social History:  reports that she has been smoking Cigarettes.  She has been smoking about 0.00 packs per day. She does not have any smokeless tobacco history on file. She reports that she does not drink alcohol or use illicit  drugs.  Family History: History reviewed. No pertinent family history.  Physical Exam: Filed Vitals:   06/28/15 1941 06/28/15 2017 06/28/15 2100 06/28/15 2130  BP:  145/86 131/75 133/70  Pulse:  96 94 94  Temp: 98.5 F (36.9 C)     Resp:  24 18 16   Height:      Weight:      SpO2:  94% 97% 99%    General:  Alert and oriented  times three, well developed and nourished, no acute distress Eyes: PERRLA, pink conjunctiva, no scleral icterus ENT: Moist oral mucosa, neck supple, no thyromegaly Lungs: clear to ascultation, Generalized wheezing, no crackles, no use of accessory muscles Cardiovascular: regular rate and rhythm, no regurgitation, no gallops, no murmurs. No carotid bruits, no JVD Abdomen: soft, positive BS, non-tender, non-distended, no organomegaly, not an acute abdomen GU: not examined Neuro: CN II - XII grossly intact, sensation intact Musculoskeletal: strength 5/5 all extremities, no clubbing, cyanosis or edema Skin: no rash, no subcutaneous crepitation, no decubitus Psych: appropriate patient   Labs on Admission:   Recent Labs  06/28/15 1940  NA 139  K 3.4*  CL 103  CO2 26  GLUCOSE 109*  BUN 11  CREATININE 0.62  CALCIUM 9.3   No results for input(s): AST, ALT, ALKPHOS, BILITOT, PROT, ALBUMIN in the last 72 hours. No results for input(s): LIPASE, AMYLASE in the last 72 hours.  Recent Labs  06/28/15 1940  WBC 15.0*  NEUTROABS 8.6*  HGB 13.7  HCT 42.5  MCV 85.4  PLT 340    Recent Labs  06/28/15 1940  TROPONINI 0.04*   Invalid input(s): POCBNP No results for input(s): DDIMER in the last 72 hours. No results for input(s): HGBA1C in the last 72 hours. No results for input(s): CHOL, HDL, LDLCALC, TRIG, CHOLHDL, LDLDIRECT in the last 72 hours. No results for input(s): TSH, T4TOTAL, T3FREE, THYROIDAB in the last 72 hours.  Invalid input(s): FREET3 No results for input(s): VITAMINB12, FOLATE, FERRITIN, TIBC, IRON, RETICCTPCT in the last 72 hours.  Micro Results: No results found for this or any previous visit (from the past 240 hour(s)).   Radiological Exams on Admission: Dg Chest Port 1 View  06/28/2015  CLINICAL DATA:  Shortness of breath and cough EXAM: PORTABLE CHEST 1 VIEW COMPARISON:  May 10, 2015 FINDINGS: No edema or consolidation. Heart size and pulmonary vascularity  are normal. No adenopathy. No bone lesions. IMPRESSION: No edema or consolidation. Electronically Signed   By: Bretta BangWilliam  Woodruff III M.D.   On: 06/28/2015 19:54    Assessment/Plan Present on Admission:  . COPD with exacerbation (HCC)  -Admit to MedSurg -Solu-Medrol schedule, duonebs, oxygen to keep sats greater than 88% -No antibiotics currently started  . Tobacco abuse -Nicotine patch, patient counseled on tobacco cessation  Mildly elevated troponin -Likely related to COPD exacerbation, we'll cycle cardiac enzymes and monitor patient on telemetry   Hypertension  -Stable, home medications resumed  Dyslipidemia -Stable, home medications resumed   Hervey Wedig 06/28/2015, 10:00 PM

## 2015-06-28 NOTE — ED Notes (Signed)
Per [facility] patient comes from home with c/o SOB. Hx of asthma, bronchitis (1 month ago), and HTN. Stating 90% on RA. Placed on 2L [place]. Patient tried her home inhaler with no relief. Wheezes noted to lung fields. EMS gave 1 duoneb treatment with albuterol. Patient A&O x4.

## 2015-06-29 ENCOUNTER — Inpatient Hospital Stay: Payer: Medicare HMO

## 2015-06-29 ENCOUNTER — Encounter: Payer: Self-pay | Admitting: Physician Assistant

## 2015-06-29 ENCOUNTER — Inpatient Hospital Stay (HOSPITAL_COMMUNITY)
Admit: 2015-06-29 | Discharge: 2015-06-29 | Disposition: A | Discharge status: Home / Self Care | Payer: Medicare HMO | Attending: Family Medicine | Admitting: Family Medicine

## 2015-06-29 DIAGNOSIS — I214 Non-ST elevation (NSTEMI) myocardial infarction: Secondary | ICD-10-CM

## 2015-06-29 DIAGNOSIS — R778 Other specified abnormalities of plasma proteins: Secondary | ICD-10-CM | POA: Insufficient documentation

## 2015-06-29 DIAGNOSIS — I10 Essential (primary) hypertension: Secondary | ICD-10-CM | POA: Diagnosis present

## 2015-06-29 DIAGNOSIS — R7989 Other specified abnormal findings of blood chemistry: Secondary | ICD-10-CM | POA: Insufficient documentation

## 2015-06-29 DIAGNOSIS — E785 Hyperlipidemia, unspecified: Secondary | ICD-10-CM | POA: Diagnosis present

## 2015-06-29 LAB — BASIC METABOLIC PANEL
Anion gap: 7 (ref 5–15)
BUN: 11 mg/dL (ref 6–20)
CALCIUM: 9.7 mg/dL (ref 8.9–10.3)
CO2: 27 mmol/L (ref 22–32)
CREATININE: 0.7 mg/dL (ref 0.44–1.00)
Chloride: 106 mmol/L (ref 101–111)
GFR calc non Af Amer: >60 mL/min (ref >60)
Glucose, Bld: 140 mg/dL — ABNORMAL HIGH (ref 65–99)
Potassium: 4.1 mmol/L (ref 3.5–5.1)
SODIUM: 140 mmol/L (ref 135–145)

## 2015-06-29 LAB — LIPID PANEL
CHOLESTEROL: 198 mg/dL (ref 0–200)
HDL: 67 mg/dL (ref >40)
LDL CALC: 123 mg/dL — AB (ref 0–99)
TRIGLYCERIDES: 39 mg/dL (ref <150)
Total CHOL/HDL Ratio: 3 RATIO
VLDL: 8 mg/dL (ref 0–40)

## 2015-06-29 LAB — CBC
HCT: 41.2 % (ref 35.0–47.0)
Hemoglobin: 13.6 g/dL (ref 12.0–16.0)
MCH: 28.1 pg (ref 26.0–34.0)
MCHC: 32.9 g/dL (ref 32.0–36.0)
MCV: 85.5 fL (ref 80.0–100.0)
PLATELETS: 328 10*3/uL (ref 150–440)
RBC: 4.82 MIL/uL (ref 3.80–5.20)
RDW: 13.5 % (ref 11.5–14.5)
WBC: 10.5 10*3/uL (ref 3.6–11.0)

## 2015-06-29 LAB — FIBRIN DERIVATIVES D-DIMER (ARMC ONLY): FIBRIN DERIVATIVES D-DIMER (ARMC): 631 — AB (ref 0–499)

## 2015-06-29 LAB — TROPONIN I
TROPONIN I: 0.72 ng/mL — AB (ref <0.031)
TROPONIN I: 0.89 ng/mL — AB (ref <0.031)
TROPONIN I: 1.07 ng/mL — AB (ref <0.031)
Troponin I: 1.44 ng/mL — ABNORMAL HIGH (ref <0.031)
Troponin I: 1.21 ng/mL — ABNORMAL HIGH (ref <0.031)

## 2015-06-29 LAB — HEMOGLOBIN A1C: Hgb A1c MFr Bld: 6 % (ref 4.0–6.0)

## 2015-06-29 MED ORDER — IPRATROPIUM-ALBUTEROL 0.5-2.5 (3) MG/3ML IN SOLN
3.0000 mL | RESPIRATORY_TRACT | Status: DC
  Administered 2015-06-29 – 2015-06-30 (×6): 3 mL via RESPIRATORY_TRACT
  Filled 2015-06-29 (×6): qty 3

## 2015-06-29 MED ORDER — METHYLPREDNISOLONE SODIUM SUCC 125 MG IJ SOLR
60.0000 mg | INTRAMUSCULAR | Status: DC
  Administered 2015-06-30: 60 mg via INTRAVENOUS
  Filled 2015-06-29: qty 2

## 2015-06-29 MED ORDER — SODIUM CHLORIDE 0.9 % IV SOLN
INTRAVENOUS | Status: DC
  Administered 2015-06-30: 05:00:00 via INTRAVENOUS

## 2015-06-29 MED ORDER — ENOXAPARIN SODIUM 100 MG/ML ~~LOC~~ SOLN
1.0000 mg/kg | Freq: Two times a day (BID) | SUBCUTANEOUS | Status: DC
  Filled 2015-06-29 (×2): qty 1

## 2015-06-29 MED ORDER — ASPIRIN 325 MG PO TABS
325.0000 mg | ORAL_TABLET | Freq: Every day | ORAL | Status: DC
  Administered 2015-06-29 – 2015-06-30 (×2): 325 mg via ORAL
  Filled 2015-06-29 (×3): qty 1

## 2015-06-29 MED ORDER — NITROGLYCERIN 0.4 MG SL SUBL: 0.4000 mg | SUBLINGUAL_TABLET | SUBLINGUAL | Status: DC | PRN

## 2015-06-29 MED ORDER — NICOTINE 14 MG/24HR TD PT24: 14.0000 mg | MEDICATED_PATCH | Freq: Every day | TRANSDERMAL | Status: DC

## 2015-06-29 MED ORDER — SODIUM CHLORIDE 0.9 % IV SOLN: 250.0000 mL | INTRAVENOUS | Status: DC | PRN

## 2015-06-29 MED ORDER — CETYLPYRIDINIUM CHLORIDE 0.05 % MT LIQD
7.0000 mL | Freq: Two times a day (BID) | OROMUCOSAL | Status: DC
Start: 2015-06-29 — End: 2015-06-30
  Administered 2015-06-29 – 2015-06-30 (×3): 7 mL via OROMUCOSAL

## 2015-06-29 MED ORDER — ENOXAPARIN SODIUM 60 MG/0.6ML ~~LOC~~ SOLN
55.0000 mg | Freq: Once | SUBCUTANEOUS | Status: AC
  Administered 2015-06-29: 55 mg via SUBCUTANEOUS
  Filled 2015-06-29: qty 0.6

## 2015-06-29 MED ORDER — ASPIRIN 81 MG PO CHEW
81.0000 mg | CHEWABLE_TABLET | ORAL | Status: AC
  Administered 2015-06-30: 81 mg via ORAL
  Filled 2015-06-29: qty 1

## 2015-06-29 MED ORDER — METOPROLOL TARTRATE 25 MG PO TABS
25.0000 mg | ORAL_TABLET | Freq: Two times a day (BID) | ORAL | Status: DC
  Administered 2015-06-29 – 2015-06-30 (×4): 25 mg via ORAL
  Filled 2015-06-29 (×4): qty 1

## 2015-06-29 MED ORDER — ENOXAPARIN SODIUM 100 MG/ML ~~LOC~~ SOLN
1.0000 mg/kg | Freq: Two times a day (BID) | SUBCUTANEOUS | Status: DC
  Administered 2015-06-29: 95 mg via SUBCUTANEOUS
  Filled 2015-06-29: qty 1

## 2015-06-29 MED ORDER — IOPAMIDOL (ISOVUE-370) INJECTION 76%
100.0000 mL | Freq: Once | INTRAVENOUS | Status: AC | PRN
  Administered 2015-06-29: 100 mL via INTRAVENOUS

## 2015-06-29 MED ORDER — SODIUM CHLORIDE 0.9% FLUSH: 3.0000 mL | INTRAVENOUS | Status: DC | PRN

## 2015-06-29 MED ORDER — SODIUM CHLORIDE 0.9% FLUSH: 3.0000 mL | Freq: Two times a day (BID) | INTRAVENOUS | Status: DC

## 2015-06-29 NOTE — Progress Notes (Addendum)
[facility] [place] at [facility]   PATIENT NAME: Deborah Montgomery    MRN#:  [id number]  DATE OF BIRTH:  16-Sep-1950  SUBJECTIVE:  Hospital Day: 1 day Deborah Montgomery is a 65 y.o. female presenting with Shortness of Breath .   Overnight events: No overnight events Interval Events: No complaints at this time states breathing is much improved  REVIEW OF SYSTEMS:  CONSTITUTIONAL: No fever, fatigue or weakness.  EYES: No blurred or double vision.  EARS, NOSE, AND THROAT: No tinnitus or ear pain.  RESPIRATORY: No cough, Positive shortness of breath, wheezing denies hemoptysis.  CARDIOVASCULAR: No chest pain, orthopnea, edema.  GASTROINTESTINAL: No nausea, vomiting, diarrhea or abdominal pain.  GENITOURINARY: No dysuria, hematuria.  ENDOCRINE: No polyuria, nocturia,  HEMATOLOGY: No anemia, easy bruising or bleeding SKIN: No rash or lesion. MUSCULOSKELETAL: No joint pain or arthritis.   NEUROLOGIC: No tingling, numbness, weakness.  PSYCHIATRY: No anxiety or depression.   DRUG ALLERGIES:  No Known Allergies  VITALS:  Blood pressure 129/77, pulse 96, temperature 98.5 F (36.9 C), temperature source Oral, resp. rate 18, height 5' 4.5" (1.638 m), weight 208 lb (94.348 kg), SpO2 99 %.  PHYSICAL EXAMINATION:  VITAL SIGNS: Filed Vitals:   06/29/15 0745 06/29/15 1201  BP: 138/78 129/77  Pulse: 94 96  Temp: 98 F (36.7 C) 98.5 F (36.9 C)  Resp: 18 18   GENERAL:65 y.o.female currently in no acute distress.  HEAD: Normocephalic, atraumatic.  EYES: Pupils equal, round, reactive to light. Extraocular muscles intact. No scleral icterus.  MOUTH: Moist mucosal membrane. Dentition intact. No abscess noted.  EAR, NOSE, THROAT: Clear without exudates. No external lesions.  NECK: Supple. No thyromegaly. No nodules. No JVD.  PULMONARY: Diffuse expiratory wheeze without rails or rhonci. No use of accessory muscles, Good respiratory effort. good air entry  bilaterally CHEST: Nontender to palpation.  CARDIOVASCULAR: S1 and S2. Regular rate and rhythm. No murmurs, rubs, or gallops. No edema. Pedal pulses 2+ bilaterally.  GASTROINTESTINAL: Soft, nontender, nondistended. No masses. Positive bowel sounds. No hepatosplenomegaly.  MUSCULOSKELETAL: No swelling, clubbing, or edema. Range of motion full in all extremities.  NEUROLOGIC: Cranial nerves II through XII are intact. No gross focal neurological deficits. Sensation intact. Reflexes intact.  SKIN: No ulceration, lesions, rashes, or cyanosis. Skin warm and dry. Turgor intact.  PSYCHIATRIC: Mood, affect within normal limits. The patient is awake, alert and oriented x 3. Insight, judgment intact.      LABORATORY PANEL:   CBC  Recent Labs Lab 06/29/15 0425  WBC 10.5  HGB 13.6  HCT 41.2  PLT 328   ------------------------------------------------------------------------------------------------------------------  Chemistries   Recent Labs Lab 06/29/15 0425  NA 140  K 4.1  CL 106  CO2 27  GLUCOSE 140*  BUN 11  CREATININE 0.70  CALCIUM 9.7   ------------------------------------------------------------------------------------------------------------------  Cardiac Enzymes  Recent Labs Lab 06/29/15 1148  TROPONINI 1.21*   ------------------------------------------------------------------------------------------------------------------  RADIOLOGY:  Dg Chest Port 1 View  06/28/2015  CLINICAL DATA:  Shortness of breath and cough EXAM: PORTABLE CHEST 1 VIEW COMPARISON:  May 10, 2015 FINDINGS: No edema or consolidation. Heart size and pulmonary vascularity are normal. No adenopathy. No bone lesions. IMPRESSION: No edema or consolidation. Electronically Signed   By: Bretta Bang III M.D.   On: 06/28/2015 19:54    EKG:   Orders placed or performed during the hospital encounter of 06/28/15  . ED EKG  . ED EKG  . EKG 12-Lead  . EKG 12-Lead  .  EKG 12-Lead  . EKG 12-Lead     ASSESSMENT AND PLAN:   Deborah Montgomery is a 65 y.o. female presenting with Shortness of Breath . Admitted 06/28/2015 : Day #: 1 day 1. Elevated trop: Aspirin, statin, cardiology consult, echocardiogram pending-therapeutic Lovenox 2. COPD exacerbation: Supplemental oxygen as required, breathing treatments, steroids 3. GERD without esophagitis: PPI therapy 4. Essential hypertension: Hold hydrochlorothiazide continue losartan 5. Venous thromboembolic embolism prophylactic: Therapeutic Lovenox  All the records are reviewed and case discussed with Care Management/Social Workerr. Management plans discussed with the patient, family and they are in agreement.  CODE STATUS: full TOTAL TIME TAKING CARE OF THIS PATIENT: 28 minutes.   POSSIBLE D/C IN 2-3DAYS, DEPENDING ON CLINICAL CONDITION.   Bettyjane Shenoy,  Mardi MainlandDavid K M.D on 06/29/2015 at 1:30 PM  Between 7am to 6pm - Pager - [phone]  After 6pm: House Pager: - [phone]  [facility]  Office  [phone]  CC: Primary care physician; Pcp Not In System

## 2015-06-29 NOTE — Progress Notes (Signed)
   CTA chest negative for PE. Will discuss timing of ischemic work up with MD. 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 Patient will need outpatient evaluation of these from her PCP after discharge.

## 2015-06-29 NOTE — Progress Notes (Signed)
[facility] troponin 0.89. See new orders

## 2015-06-29 NOTE — Consult Note (Signed)
Cardiology Consultation Note  Patient ID: Deborah Montgomery, MRN: [id number], DOB/AGE: 1950/06/18 65 y.o. Admit date: 06/28/2015   Date of Consult: 06/29/2015 Primary Physician: Pcp Not In System Primary Cardiologist: New to [facility] Requesting Physician: Dr. Tobi Bastos, MD  Chief Complaint: SOB Reason for Consult: Elevated troponin   HPI: 65 y.o. female with h/o recently formally diagnosed COPD in mid May 2017 secondary to ongoing tobacco abuse, HTN, HLD with statin intolerance to simvastatin, anxiety, and GERD who presented to [facility] on 6/7 with acute worsening of SOB. Cardiology is consulted for elevation troponin.   She has no previously known cardiac history. She presented to [facility] with complaints of sharp chest pain for a brief episode on 6/4 with difficulty breathing that acutely worsened on 6/5 followed by further worsening on 6/7. She has had intermittent sharp chest pain (she cannot remember right or left side) with associated deep inspiration. Of note, the patient just recently went on a 4.5 hour road trip to the beach at the end of May 2017. She denies noting LE erythema, swelling, warmth, or TTP. She was using her inhalers, but without aid in improving her symptoms. There was some associated nonproductive cough and wheezing. Her SOB continued to worsen prompting her to come into the hospital via EMS. En route she apparently received nebs x 2 followed by 3 more times in the ED with ongoing wheezing. Her O2 saturations were noted to be 90% on room air.    Upon the patient's arrival to [facility] they were found to have a troponin level of 0.04-->0.89-->1.44, K+3.4-->4.1, SCr 0.62, WBC 15.0-->10.5, hgb 13.7, LDL 123. ECG showed NSR, 93 bpm, TWI aVL, CXR showed no acute cardiopulmonary abnormality. Cardiology has added on further troponin levels to continue to cycle given continued upward trend. Currently, remains SOB on nasal cannula and tachycardic.   Past Medical History  Diagnosis Date  . Essential  hypertension   . Arthritis   . GERD (gastroesophageal reflux disease)   . Eye problem   . HLD (hyperlipidemia)     a. intolerant to simvastatin   . Anxiety   . COPD (chronic obstructive pulmonary disease) (HCC)     a. diagnosed 05/2015  . Tobacco abuse       Most Recent Cardiac Studies: None, echo is pending at this time.    Surgical History:  Past Surgical History  Procedure Laterality Date  . Abdominal hysterectomy    . Carpal tunnel release       Home Meds: Prior to Admission medications   Medication Sig Start Date End Date Taking? Authorizing Provider  albuterol (PROVENTIL HFA;VENTOLIN HFA) 108 (90 BASE) MCG/ACT inhaler Inhale 2 puffs into the lungs every 4 (four) hours as needed for wheezing or shortness of breath. 12/17/14  Yes Christiane Ha D Cuthriell, PA-C  atorvastatin (LIPITOR) 40 MG tablet Take 40 mg by mouth at bedtime.   Yes Historical Provider, MD  diclofenac sodium (VOLTAREN) 1 % GEL Apply 2 g topically 4 (four) times daily as needed (for shoulder pain).   Yes Historical Provider, MD  fluticasone (FLONASE) 50 MCG/ACT nasal spray Place 2 sprays into both nostrils daily as needed for rhinitis.   Yes Historical Provider, MD  hydrochlorothiazide (HYDRODIURIL) 25 MG tablet Take 25 mg by mouth daily.   Yes Historical Provider, MD  latanoprost (XALATAN) 0.005 % ophthalmic solution Place 1 drop into both eyes at bedtime.   Yes Historical Provider, MD  losartan (COZAAR) 25 MG tablet Take 25 mg by mouth daily.  Yes Historical Provider, MD  omeprazole (PRILOSEC) 20 MG capsule Take 20 mg by mouth daily.   Yes Historical Provider, MD  pseudoephedrine-dextromethorphan-guaifenesin (ROBITUSSIN-PE) 30-10-100 MG/5ML solution Take 10 mLs by mouth 4 (four) times daily as needed for cough.   Yes Historical Provider, MD  tiotropium (SPIRIVA) 18 MCG inhalation capsule Place 18 mcg into inhaler and inhale daily.   Yes Historical Provider, MD  benzonatate (TESSALON) 200 MG capsule Take 1 capsule  (200 mg total) by mouth 3 (three) times daily as needed for cough. Patient not taking: Reported on 06/28/2015 05/10/15   Darci Current, MD    Inpatient Medications:  . antiseptic oral rinse  7 mL Mouth Rinse BID  . aspirin  325 mg Oral Daily  . atorvastatin  40 mg Oral QHS  . enoxaparin (LOVENOX) injection  1 mg/kg Subcutaneous Q12H  . hydrochlorothiazide  25 mg Oral Daily  . ipratropium-albuterol  3 mL Nebulization Q4H  . latanoprost  1 drop Both Eyes QHS  . losartan  25 mg Oral Daily  . [START ON 06/30/2015] methylPREDNISolone (SOLU-MEDROL) injection  60 mg Intravenous Q24H  . metoprolol tartrate  25 mg Oral BID  . nicotine  14 mg Transdermal Daily  . pantoprazole  40 mg Oral QAC breakfast      Allergies: No Known Allergies  Social History   Social History  . Marital Status: Widowed    Spouse Name: N/A  . Number of Children: N/A  . Years of Education: N/A   Occupational History  . Not on file.   Social History Main Topics  . Smoking status: Current Every Day Smoker -- 0.00 packs/day    Types: Cigarettes  . Smokeless tobacco: Not on file  . Alcohol Use: No  . Drug Use: No  . Sexual Activity: Not on file   Other Topics Concern  . Not on file   Social History Narrative     Family History  Problem Relation Age of Onset  . Breast cancer Paternal Aunt   . Breast cancer Paternal Aunt      Review of Systems: Review of Systems  Constitutional: Positive for malaise/fatigue. Negative for fever, chills, weight loss and diaphoresis.  HENT: Negative for congestion.   Eyes: Negative for discharge and redness.  Respiratory: Positive for cough, shortness of breath and wheezing. Negative for hemoptysis and sputum production.   Cardiovascular: Positive for chest pain. Negative for palpitations, orthopnea, claudication, leg swelling and PND.  Gastrointestinal: Negative for heartburn, nausea, vomiting and abdominal pain.  Musculoskeletal: Negative for myalgias and falls.    Skin: Negative for rash.  Neurological: Positive for weakness. Negative for dizziness, tingling, tremors, sensory change, speech change, focal weakness and loss of consciousness.  Endo/Heme/Allergies: Does not bruise/bleed easily.  Psychiatric/Behavioral: Positive for substance abuse. The patient is not nervous/anxious.        Ongoing tobacco abuse  All other systems reviewed and are negative.   Labs:  Recent Labs  06/28/15 1940 06/29/15 0058 06/29/15 0425  TROPONINI 0.04* 0.89* 1.44*   Lab Results  Component Value Date   WBC 10.5 06/29/2015   HGB 13.6 06/29/2015   HCT 41.2 06/29/2015   MCV 85.5 06/29/2015   PLT 328 06/29/2015     Recent Labs Lab 06/29/15 0425  NA 140  K 4.1  CL 106  CO2 27  BUN 11  CREATININE 0.70  CALCIUM 9.7  GLUCOSE 140*   Lab Results  Component Value Date   CHOL 198 06/29/2015   HDL  67 06/29/2015   LDLCALC 123* 06/29/2015   TRIG 39 06/29/2015   No results found for: DDIMER  Radiology/Studies:  Dg Chest Port 1 View  06/28/2015  CLINICAL DATA:  Shortness of breath and cough EXAM: PORTABLE CHEST 1 VIEW COMPARISON:  May 10, 2015 FINDINGS: No edema or consolidation. Heart size and pulmonary vascularity are normal. No adenopathy. No bone lesions. IMPRESSION: No edema or consolidation. Electronically Signed   By: Bretta BangWilliam  Woodruff III M.D.   On: 06/28/2015 19:54    EKG: Interpreted by me showed: NSR, 93 bpm, TWI aVL Telemetry: Interpreted by me showed: sinus tachycardia, 120's bpm  Weights: Filed Weights   06/28/15 1937 06/29/15 0020  Weight: 210 lb (95.255 kg) 208 lb (94.348 kg)     Physical Exam: Blood pressure 138/78, pulse 94, temperature 98 F (36.7 C), temperature source Oral, resp. rate 18, height 5' 4.5" (1.638 m), weight 208 lb (94.348 kg), SpO2 100 %. Body mass index is 35.16 kg/(m^2). General: Well developed, well nourished, in no acute distress. Head: Normocephalic, atraumatic, sclera non-icteric, no xanthomas, nares are  without discharge.  Neck: Negative for carotid bruits. JVD not elevated. Lungs: Diffuse bilateral wheezing. Breathing is unlabored. Heart: RRR with S1 S2. No murmurs, rubs, or gallops appreciated. Abdomen: Obese, soft, non-tender, non-distended with normoactive bowel sounds. No hepatomegaly. No rebound/guarding. No obvious abdominal masses. Msk:  Strength and tone appear normal for age. Extremities: No clubbing or cyanosis. No edema. Distal pedal pulses are 2+ and equal bilaterally. No erythema, TTP, swelling, or cording along the bilateral LE.  Neuro: Alert and oriented X 3. No facial asymmetry. No focal deficit. Moves all extremities spontaneously. Psych:  Responds to questions appropriately with a normal affect.    Assessment and Plan:  Principal Problem:   Elevated troponin Active Problems:   COPD with exacerbation (HCC)   Tobacco abuse   Essential hypertension   HLD (hyperlipidemia)   Anxiety    1. Elevated troponin: -Troponin has trended up to 1.44 (resulted at 4:25 AM on 6/8) at time of cardiology consultation  -EKG non-acute -Presenting symptom of SOB, one brief episode of sharp chest pain on Sunday, 06/25/15, followed by intermittent sharp pain with deep inspiration  -Uncertain at this time if troponin elevation is related to PE vs NSTEMI -I ordered a stat d-dimer, if elevated would recommend pursuing PE/DVT evaluation given patient recently went on a 4[place]5 hour trip to the beach at the end of [place], is in sinus tachycardia on telemetry with heart rates in the 120's bpm, and hypoxic still requiring nasal cannula to maintain adequate oxygen saturations  -Placed on full-dose Lovenox by IM -If PE evaluation is unrevealing today will need ischemic work up with cardiac catheterization. Timing to be determined based on MD schedule  -Check echo to evaluate LVSF, RV size and systolic function, wall motion and right-sided pressure -Check A1C for further risk stratification  -LDL as  below  2. Acute respiratory distress/possible COPD exacerbation: -CXR non-acute -On steroids and inhalers per IM  3. HTN: -Controlled -Continue current medications  4. HLD: -Lipitor -LDL 123, goal <70   Signed, Eula ListenRyan Shaylinn Hladik, PA-C [facility] Pager: [phone] 06/29/2015, 11:11 AM

## 2015-06-29 NOTE — Care Management (Signed)
COPD requiring new O2. Patient is from home where she has been independent with daily activities; able to drive. [facility]. Her daughter is present and supportive.

## 2015-06-29 NOTE — Progress Notes (Signed)
Spoke with MD Ihor AustinPavan Pyreddy, advised about new troponin 1.44. Pt is A+O with no signs of distress.  MD will review

## 2015-06-29 NOTE — Care Management Important Message (Signed)
Important Message  Patient Details  Name: Deborah Montgomery MRN: [id number] Date of Birth: 01-25-1950   Medicare Important Message Given:  Yes    Gwenette GreetBrenda S Sharday Michl, RN 06/29/2015, 8:11 AM

## 2015-06-29 NOTE — Progress Notes (Signed)
   D dimer elevated at 631. Troponin now down trending to 1.21. Well's score of 6, high risk of PE with 16.2% prevalence. CTA chest PE protocol ordered.

## 2015-06-30 ENCOUNTER — Encounter
Admission: EM | Disposition: A | Discharge status: Home / Self Care | Payer: Self-pay | Source: Home / Self Care | Attending: Internal Medicine

## 2015-06-30 ENCOUNTER — Encounter: Payer: Self-pay | Admitting: Cardiovascular Disease

## 2015-06-30 DIAGNOSIS — I214 Non-ST elevation (NSTEMI) myocardial infarction: Secondary | ICD-10-CM

## 2015-06-30 HISTORY — PX: CARDIAC CATHETERIZATION: SHX172

## 2015-06-30 LAB — ECHOCARDIOGRAM COMPLETE
CHL CUP MV DEC (S): 282
E decel time: 282 msec
EERAT: 6.11
FS: 38 % (ref 28–44)
Height: 64.5 in
IV/PV OW: 1.3
LA ID, A-P, ES: 28 mm
LA vol A4C: 33.3 ml
LA vol index: 16.1 mL/m2
LA vol: 34 mL
LADIAMINDEX: 1.33 cm/m2
LEFT ATRIUM END SYS DIAM: 28 mm
LV TDI E'LATERAL: 10
LV TDI E'MEDIAL: 7.72
LVEEAVG: 6.11
LVEEMED: 6.11
LVELAT: 10 cm/s
MV pk A vel: 105 m/s
MV pk E vel: 61.1 m/s
PW: 8.18 mm — AB (ref 0.6–1.1)
TAPSE: 18.2 mm
Weight: 3328 oz

## 2015-06-30 LAB — PROTIME-INR
INR: 1.01
Prothrombin Time: 13.5 seconds (ref 11.4–15.0)

## 2015-06-30 SURGERY — LEFT HEART CATH AND CORONARY ANGIOGRAPHY
Anesthesia: Moderate Sedation

## 2015-06-30 MED ORDER — BENZONATATE 100 MG PO CAPS: 100.0000 mg | ORAL_CAPSULE | Freq: Three times a day (TID) | ORAL | Status: DC | PRN

## 2015-06-30 MED ORDER — EPINEPHRINE HCL 0.1 MG/ML IJ SOSY
PREFILLED_SYRINGE | INTRAMUSCULAR | Status: AC
  Filled 2015-06-30: qty 20

## 2015-06-30 MED ORDER — ASPIRIN EC 81 MG PO TBEC: 81.0000 mg | DELAYED_RELEASE_TABLET | Freq: Every day | ORAL | Status: DC

## 2015-06-30 MED ORDER — ENOXAPARIN SODIUM 40 MG/0.4ML ~~LOC~~ SOLN: 40.0000 mg | SUBCUTANEOUS | Status: DC

## 2015-06-30 MED ORDER — NICOTINE 14 MG/24HR TD PT24: 14.0000 mg | MEDICATED_PATCH | Freq: Every day | TRANSDERMAL | Status: DC

## 2015-06-30 MED ORDER — MIDAZOLAM HCL 2 MG/2ML IJ SOLN
INTRAMUSCULAR | Status: AC
  Filled 2015-06-30: qty 2

## 2015-06-30 MED ORDER — IOPAMIDOL (ISOVUE-300) INJECTION 61%
INTRAVENOUS | Status: DC | PRN
  Administered 2015-06-30: 110 mL via INTRA_ARTERIAL

## 2015-06-30 MED ORDER — ASPIRIN 81 MG PO TBEC: 81.0000 mg | DELAYED_RELEASE_TABLET | Freq: Every day | ORAL | Status: DC

## 2015-06-30 MED ORDER — HEPARIN SODIUM (PORCINE) 1000 UNIT/ML IJ SOLN
INTRAMUSCULAR | Status: DC | PRN
  Administered 2015-06-30: 5000 [IU] via INTRAVENOUS

## 2015-06-30 MED ORDER — VERAPAMIL HCL 2.5 MG/ML IV SOLN
INTRAVENOUS | Status: DC | PRN
  Administered 2015-06-30: 2.5 mg via INTRA_ARTERIAL

## 2015-06-30 MED ORDER — FENTANYL CITRATE (PF) 100 MCG/2ML IJ SOLN
INTRAMUSCULAR | Status: AC
  Filled 2015-06-30: qty 2

## 2015-06-30 MED ORDER — SODIUM CHLORIDE 0.9 % WEIGHT BASED INFUSION
1.0000 mL/kg/h | INTRAVENOUS | Status: DC
  Administered 2015-06-30: 1 mL/kg/h via INTRAVENOUS

## 2015-06-30 MED ORDER — HYDROCOD POLST-CPM POLST ER 10-8 MG/5ML PO SUER
ORAL | Status: AC
  Filled 2015-06-30: qty 5

## 2015-06-30 MED ORDER — SODIUM CHLORIDE 0.9 % IV SOLN: 250.0000 mL | INTRAVENOUS | Status: DC | PRN

## 2015-06-30 MED ORDER — PREDNISONE 10 MG (21) PO TBPK: 10.0000 mg | ORAL_TABLET | Freq: Every day | ORAL | Status: DC

## 2015-06-30 MED ORDER — HEPARIN (PORCINE) IN NACL 2-0.9 UNIT/ML-% IJ SOLN
INTRAMUSCULAR | Status: AC
  Filled 2015-06-30: qty 500

## 2015-06-30 MED ORDER — HEPARIN SODIUM (PORCINE) 1000 UNIT/ML IJ SOLN
INTRAMUSCULAR | Status: AC
  Filled 2015-06-30: qty 1

## 2015-06-30 MED ORDER — IPRATROPIUM-ALBUTEROL 0.5-2.5 (3) MG/3ML IN SOLN: 3.0000 mL | RESPIRATORY_TRACT | Status: DC

## 2015-06-30 MED ORDER — MIDAZOLAM HCL 2 MG/2ML IJ SOLN
INTRAMUSCULAR | Status: DC | PRN
  Administered 2015-06-30 (×2): 1 mg via INTRAVENOUS

## 2015-06-30 MED ORDER — FENTANYL CITRATE (PF) 100 MCG/2ML IJ SOLN
INTRAMUSCULAR | Status: DC | PRN
  Administered 2015-06-30 (×2): 25 ug via INTRAVENOUS

## 2015-06-30 MED ORDER — HYDROCOD POLST-CPM POLST ER 10-8 MG/5ML PO SUER
ORAL | Status: DC | PRN
  Administered 2015-06-30: 5 mL via ORAL

## 2015-06-30 MED ORDER — SODIUM CHLORIDE 0.9% FLUSH: 3.0000 mL | INTRAVENOUS | Status: DC | PRN

## 2015-06-30 MED ORDER — VERAPAMIL HCL 2.5 MG/ML IV SOLN
INTRAVENOUS | Status: AC
  Filled 2015-06-30: qty 2

## 2015-06-30 MED ORDER — SODIUM CHLORIDE 0.9% FLUSH: 3.0000 mL | Freq: Two times a day (BID) | INTRAVENOUS | Status: DC

## 2015-06-30 SURGICAL SUPPLY — 8 items
CATH INFINITI 5 FR JL3.5 (CATHETERS) ×3 IMPLANT
CATH INFINITI JR4 5F (CATHETERS) ×3 IMPLANT
CATH OPTITORQUE JACKY 4.0 5F (CATHETERS) ×3 IMPLANT
DEVICE RAD TR BAND REGULAR (VASCULAR PRODUCTS) ×3 IMPLANT
GLIDESHEATH SLEND SS 6F .021 (SHEATH) ×3 IMPLANT
KIT MANI 3VAL PERCEP (MISCELLANEOUS) ×3 IMPLANT
PACK CARDIAC CATH (CUSTOM PROCEDURE TRAY) ×3 IMPLANT
WIRE SAFE-T 1.5MM-J .035X260CM (WIRE) ×3 IMPLANT

## 2015-06-30 NOTE — Discharge Summary (Signed)
[facility] [place] [facility]   PATIENT NAME: Deborah Montgomery    MR#:  [id number]  DATE OF BIRTH:  Feb 15, 1950  DATE OF ADMISSION:  06/28/2015 ADMITTING PHYSICIAN: Gery Prayebby Crosley, MD  DATE OF DISCHARGE: 06/30/2015  PRIMARY CARE PHYSICIAN: Pcp Not In System    ADMISSION DIAGNOSIS:  COPD exacerbation (HCC) [J44.1]  DISCHARGE DIAGNOSIS:     Elevated troponin   COPD with exacerbation (HCC)   Tobacco abuse   Anxiety   Essential hypertension   HLD (hyperlipidemia)   SECONDARY DIAGNOSIS:   Past Medical History  Diagnosis Date  . Essential hypertension   . Arthritis   . GERD (gastroesophageal reflux disease)   . Eye problem   . HLD (hyperlipidemia)     a. intolerant to simvastatin   . Anxiety   . COPD (chronic obstructive pulmonary disease) (HCC)     a. diagnosed 05/2015  . Tobacco abuse   . Asthma     HOSPITAL COURSE:  Deborah Montgomery  is a 65 y.o. female admitted 06/28/2015 with chief complaint Shortness of Breath . Please see H&P performed by Gery Prayebby Crosley, MD for further information. She presented with the above symptoms believed to be secondary to COPD exacerbation, however noted to have elevated troponin. Started on breathing treatments and steroids with improvement. Cardiology evaluated during her stay. Work up negative for pulmonary embolism, underwent cardiac cath which was unrevealing. Symptoms resolved  DISCHARGE CONDITIONS:   stable  CONSULTS OBTAINED:  Treatment Team:  Iran OuchMuhammad A Arida, MD  DRUG ALLERGIES:  No Known Allergies  DISCHARGE MEDICATIONS:   Current Discharge Medication List    START taking these medications   Details  aspirin EC 81 MG EC tablet Take 1 tablet (81 mg total) by mouth daily. Qty: 30 tablet, Refills: 0    ipratropium-albuterol (DUONEB) 0.5-2.5 (3) MG/3ML SOLN Take 3 mLs by nebulization every 4 (four) hours. Qty: 360 mL, Refills: 0    nicotine (NICODERM CQ - DOSED IN MG/24 HOURS) 14 mg/24hr patch Place 1  patch (14 mg total) onto the skin daily. Qty: 28 patch, Refills: 0    predniSONE (STERAPRED UNI-PAK 21 TAB) 10 MG (21) TBPK tablet Take 1 tablet (10 mg total) by mouth daily. 40mg  1 day, 20mg  2 days, 10mg  2 days then stop Qty: 10 tablet, Refills: 0      CONTINUE these medications which have CHANGED   Details  benzonatate (TESSALON) 100 MG capsule Take 1 capsule (100 mg total) by mouth every 8 (eight) hours as needed for cough. Qty: 20 capsule, Refills: 0      CONTINUE these medications which have NOT CHANGED   Details  albuterol (PROVENTIL HFA;VENTOLIN HFA) 108 (90 BASE) MCG/ACT inhaler Inhale 2 puffs into the lungs every 4 (four) hours as needed for wheezing or shortness of breath. Qty: 1 Inhaler, Refills: 0    atorvastatin (LIPITOR) 40 MG tablet Take 40 mg by mouth at bedtime.    diclofenac sodium (VOLTAREN) 1 % GEL Apply 2 g topically 4 (four) times daily as needed (for shoulder pain).    fluticasone (FLONASE) 50 MCG/ACT nasal spray Place 2 sprays into both nostrils daily as needed for rhinitis.    hydrochlorothiazide (HYDRODIURIL) 25 MG tablet Take 25 mg by mouth daily.    latanoprost (XALATAN) 0.005 % ophthalmic solution Place 1 drop into both eyes at bedtime.    losartan (COZAAR) 25 MG tablet Take 25 mg by mouth daily.    omeprazole (PRILOSEC) 20 MG capsule Take  20 mg by mouth daily.    pseudoephedrine-dextromethorphan-guaifenesin (ROBITUSSIN-PE) 30-10-100 MG/5ML solution Take 10 mLs by mouth 4 (four) times daily as needed for cough.    tiotropium (SPIRIVA) 18 MCG inhalation capsule Place 18 mcg into inhaler and inhale daily.         DISCHARGE INSTRUCTIONS:    DIET:  Cardiac diet  DISCHARGE CONDITION:  Stable  ACTIVITY:  Activity as tolerated  OXYGEN:  Home Oxygen: No.   Oxygen Delivery: room air  DISCHARGE LOCATION:  home   If you experience worsening of your admission symptoms, develop shortness of breath, life threatening emergency, suicidal or  homicidal thoughts you must seek medical attention immediately by calling 911 or calling your MD immediately  if symptoms less severe.  You Must read complete instructions/literature along with all the possible adverse reactions/side effects for all the Medicines you take and that have been prescribed to you. Take any new Medicines after you have completely understood and accpet all the possible adverse reactions/side effects.   Please note  You were cared for by a hospitalist during your hospital stay. If you have any questions about your discharge medications or the care you received while you were in the hospital after you are discharged, you can call the unit and asked to speak with the hospitalist on call if the hospitalist that took care of you is not available. Once you are discharged, your primary care physician will handle any further medical issues. Please note that NO REFILLS for any discharge medications will be authorized once you are discharged, as it is imperative that you return to your primary care physician (or establish a relationship with a primary care physician if you do not have one) for your aftercare needs so that they can reassess your need for medications and monitor your lab values.    On the day of Discharge:   VITAL SIGNS:  Blood pressure 125/63, pulse 89, temperature 97.8 F (36.6 C), temperature source Oral, resp. rate 18, height  (1.626 m), weight 211 lb (95.709 kg), SpO2 97 %.  I/O:   Intake/Output Summary (Last 24 hours) at 06/30/15 1723 Last data filed at 06/29/15 1835  Gross per 24 hour  Intake      0 ml  Output      0 ml  Net      0 ml    PHYSICAL EXAMINATION:  GENERAL:  65 y.o.-year-old patient lying in the bed with no acute distress.  EYES: Pupils equal, round, reactive to light and accommodation. No scleral icterus. Extraocular muscles intact.  HEENT: Head atraumatic, normocephalic. Oropharynx and nasopharynx clear.  NECK:  Supple, no  jugular venous distention. No thyroid enlargement, no tenderness.  LUNGS: Normal breath sounds bilaterally, no wheezing, rales,rhonchi or crepitation. No use of accessory muscles of respiration.  CARDIOVASCULAR: S1, S2 normal. No murmurs, rubs, or gallops.  ABDOMEN: Soft, non-tender, non-distended. Bowel sounds present. No organomegaly or mass.  EXTREMITIES: No pedal edema, cyanosis, or clubbing.  NEUROLOGIC: Cranial nerves II through XII are intact. Muscle strength 5/5 in all extremities. Sensation intact. Gait not checked.  PSYCHIATRIC: The patient is alert and oriented x 3.  SKIN: No obvious rash, lesion, or ulcer.   DATA REVIEW:   CBC  Recent Labs Lab 06/29/15 0425  WBC 10.5  HGB 13.6  HCT 41.2  PLT 328    Chemistries   Recent Labs Lab 06/29/15 0425  NA 140  K 4.1  CL 106  CO2 27  GLUCOSE 140*  BUN 11  CREATININE 0.70  CALCIUM 9.7    Cardiac Enzymes  Recent Labs Lab 06/29/15 2251  TROPONINI 0.72*    Microbiology Results  No results found for this or any previous visit.  RADIOLOGY:  Ct Angio Chest Pe W/cm &/or Wo Cm  06/29/2015  CLINICAL DATA:  Acute shortness of breath, tachycardia, elevated D-dimer EXAM: CT ANGIOGRAPHY CHEST WITH CONTRAST TECHNIQUE: Multidetector CT imaging of the chest was performed using the standard protocol during bolus administration of intravenous contrast. Multiplanar CT image reconstructions and MIPs were obtained to evaluate the vascular anatomy. CONTRAST:  100 mL Isovue 370 COMPARISON:  None. FINDINGS: Mediastinum/Lymph Nodes: No pulmonary emboli or thoracic aortic dissection identified. No masses or pathologically enlarged lymph nodes identified. Lungs/Pleura: No focal consolidation, pleural effusion or pneumothorax. Moderate centrilobular emphysema. Upper abdomen: No acute findings. Musculoskeletal: No chest wall mass or suspicious bone lesions identified. Other: 3 x 2.2 cm complex hypodense left thyroid mass. 4.3 x 2.3 cm masslike  area in the central left breast of uncertain etiology. Review of the MIP images confirms the above findings. IMPRESSION: 1. No evidence pulmonary embolus. 2. No active cardiopulmonary disease. 3. Moderate centrilobular emphysema. 4. 3 x 2.2 cm complex hypodense left thyroid mass. Recommend further evaluation with a dedicated thyroid ultrasound. 5. 4.3 x 2.3 cm masslike area in the central left breast of uncertain etiology. Recommend further evaluation with mammography. Electronically Signed   By: Elige Ko   On: 06/29/2015 15:58   Dg Chest Port 1 View  06/28/2015  CLINICAL DATA:  Shortness of breath and cough EXAM: PORTABLE CHEST 1 VIEW COMPARISON:  May 10, 2015 FINDINGS: No edema or consolidation. Heart size and pulmonary vascularity are normal. No adenopathy. No bone lesions. IMPRESSION: No edema or consolidation. Electronically Signed   By: Bretta Bang III M.D.   On: 06/28/2015 19:54     Management plans discussed with the patient, family and they are in agreement.  CODE STATUS:     Code Status Orders        Start     Ordered   06/28/15 2349  Full code   Continuous     06/28/15 2348    Code Status History    Date Active Date Inactive Code Status Order ID Comments User Context   This patient has a current code status but no historical code status.      TOTAL TIME TAKING CARE OF THIS PATIENT: 28 minutes.    Donita Newland,  Mardi Mainland.D on 06/30/2015 at 5:23 PM  Between 7am to 6pm - Pager - [phone]  After 6pm go to [email or web address] - Social research officer, government  [facility]  Office  [phone]  CC: Primary care physician; Pcp Not In System

## 2015-06-30 NOTE — Progress Notes (Signed)
Pt alert and oriented x4, no complaints of pain or discomfort.  Bed in low position, call bell within reach.  Bed alarms on and functioning.  Assessment done and charted.  Will continue to monitor and do hourly rounding throughout the shift.  Pt is scheduled to go for a cardiac cath this shift

## 2015-06-30 NOTE — Progress Notes (Signed)
Pt clinically stable post heart cath, vss, no bleeding nor hematoma at right radial site, Dr Kirke CorinArida out to speak with ptatient with questions answered, sr per monitor, sats stable, [facility] with plan reviewed,

## 2015-06-30 NOTE — H&P (View-Only) (Signed)
Cardiology Consultation Note  Patient ID: Deborah Montgomery, MRN: [id number], DOB/AGE: 1950/06/18 65 y.o. Admit date: 06/28/2015   Date of Consult: 06/29/2015 Primary Physician: Pcp Not In System Primary Cardiologist: New to [facility] Requesting Physician: Dr. Tobi Bastos, MD  Chief Complaint: SOB Reason for Consult: Elevated troponin   HPI: 65 y.o. female with h/o recently formally diagnosed COPD in mid May 2017 secondary to ongoing tobacco abuse, HTN, HLD with statin intolerance to simvastatin, anxiety, and GERD who presented to [facility] on 6/7 with acute worsening of SOB. Cardiology is consulted for elevation troponin.   She has no previously known cardiac history. She presented to [facility] with complaints of sharp chest pain for a brief episode on 6/4 with difficulty breathing that acutely worsened on 6/5 followed by further worsening on 6/7. She has had intermittent sharp chest pain (she cannot remember right or left side) with associated deep inspiration. Of note, the patient just recently went on a 4.5 hour road trip to the beach at the end of May 2017. She denies noting LE erythema, swelling, warmth, or TTP. She was using her inhalers, but without aid in improving her symptoms. There was some associated nonproductive cough and wheezing. Her SOB continued to worsen prompting her to come into the hospital via EMS. En route she apparently received nebs x 2 followed by 3 more times in the ED with ongoing wheezing. Her O2 saturations were noted to be 90% on room air.    Upon the patient's arrival to [facility] they were found to have a troponin level of 0.04-->0.89-->1.44, K+3.4-->4.1, SCr 0.62, WBC 15.0-->10.5, hgb 13.7, LDL 123. ECG showed NSR, 93 bpm, TWI aVL, CXR showed no acute cardiopulmonary abnormality. Cardiology has added on further troponin levels to continue to cycle given continued upward trend. Currently, remains SOB on nasal cannula and tachycardic.   Past Medical History  Diagnosis Date  . Essential  hypertension   . Arthritis   . GERD (gastroesophageal reflux disease)   . Eye problem   . HLD (hyperlipidemia)     a. intolerant to simvastatin   . Anxiety   . COPD (chronic obstructive pulmonary disease) (HCC)     a. diagnosed 05/2015  . Tobacco abuse       Most Recent Cardiac Studies: None, echo is pending at this time.    Surgical History:  Past Surgical History  Procedure Laterality Date  . Abdominal hysterectomy    . Carpal tunnel release       Home Meds: Prior to Admission medications   Medication Sig Start Date End Date Taking? Authorizing Provider  albuterol (PROVENTIL HFA;VENTOLIN HFA) 108 (90 BASE) MCG/ACT inhaler Inhale 2 puffs into the lungs every 4 (four) hours as needed for wheezing or shortness of breath. 12/17/14  Yes Christiane Ha D Cuthriell, PA-C  atorvastatin (LIPITOR) 40 MG tablet Take 40 mg by mouth at bedtime.   Yes Historical Provider, MD  diclofenac sodium (VOLTAREN) 1 % GEL Apply 2 g topically 4 (four) times daily as needed (for shoulder pain).   Yes Historical Provider, MD  fluticasone (FLONASE) 50 MCG/ACT nasal spray Place 2 sprays into both nostrils daily as needed for rhinitis.   Yes Historical Provider, MD  hydrochlorothiazide (HYDRODIURIL) 25 MG tablet Take 25 mg by mouth daily.   Yes Historical Provider, MD  latanoprost (XALATAN) 0.005 % ophthalmic solution Place 1 drop into both eyes at bedtime.   Yes Historical Provider, MD  losartan (COZAAR) 25 MG tablet Take 25 mg by mouth daily.  Yes Historical Provider, MD  omeprazole (PRILOSEC) 20 MG capsule Take 20 mg by mouth daily.   Yes Historical Provider, MD  pseudoephedrine-dextromethorphan-guaifenesin (ROBITUSSIN-PE) 30-10-100 MG/5ML solution Take 10 mLs by mouth 4 (four) times daily as needed for cough.   Yes Historical Provider, MD  tiotropium (SPIRIVA) 18 MCG inhalation capsule Place 18 mcg into inhaler and inhale daily.   Yes Historical Provider, MD  benzonatate (TESSALON) 200 MG capsule Take 1 capsule  (200 mg total) by mouth 3 (three) times daily as needed for cough. Patient not taking: Reported on 06/28/2015 05/10/15   Darci Current, MD    Inpatient Medications:  . antiseptic oral rinse  7 mL Mouth Rinse BID  . aspirin  325 mg Oral Daily  . atorvastatin  40 mg Oral QHS  . enoxaparin (LOVENOX) injection  1 mg/kg Subcutaneous Q12H  . hydrochlorothiazide  25 mg Oral Daily  . ipratropium-albuterol  3 mL Nebulization Q4H  . latanoprost  1 drop Both Eyes QHS  . losartan  25 mg Oral Daily  . [START ON 06/30/2015] methylPREDNISolone (SOLU-MEDROL) injection  60 mg Intravenous Q24H  . metoprolol tartrate  25 mg Oral BID  . nicotine  14 mg Transdermal Daily  . pantoprazole  40 mg Oral QAC breakfast      Allergies: No Known Allergies  Social History   Social History  . Marital Status: Widowed    Spouse Name: N/A  . Number of Children: N/A  . Years of Education: N/A   Occupational History  . Not on file.   Social History Main Topics  . Smoking status: Current Every Day Smoker -- 0.00 packs/day    Types: Cigarettes  . Smokeless tobacco: Not on file  . Alcohol Use: No  . Drug Use: No  . Sexual Activity: Not on file   Other Topics Concern  . Not on file   Social History Narrative     Family History  Problem Relation Age of Onset  . Breast cancer Paternal Aunt   . Breast cancer Paternal Aunt      Review of Systems: Review of Systems  Constitutional: Positive for malaise/fatigue. Negative for fever, chills, weight loss and diaphoresis.  HENT: Negative for congestion.   Eyes: Negative for discharge and redness.  Respiratory: Positive for cough, shortness of breath and wheezing. Negative for hemoptysis and sputum production.   Cardiovascular: Positive for chest pain. Negative for palpitations, orthopnea, claudication, leg swelling and PND.  Gastrointestinal: Negative for heartburn, nausea, vomiting and abdominal pain.  Musculoskeletal: Negative for myalgias and falls.    Skin: Negative for rash.  Neurological: Positive for weakness. Negative for dizziness, tingling, tremors, sensory change, speech change, focal weakness and loss of consciousness.  Endo/Heme/Allergies: Does not bruise/bleed easily.  Psychiatric/Behavioral: Positive for substance abuse. The patient is not nervous/anxious.        Ongoing tobacco abuse  All other systems reviewed and are negative.   Labs:  Recent Labs  06/28/15 1940 06/29/15 0058 06/29/15 0425  TROPONINI 0.04* 0.89* 1.44*   Lab Results  Component Value Date   WBC 10.5 06/29/2015   HGB 13.6 06/29/2015   HCT 41.2 06/29/2015   MCV 85.5 06/29/2015   PLT 328 06/29/2015     Recent Labs Lab 06/29/15 0425  NA 140  K 4.1  CL 106  CO2 27  BUN 11  CREATININE 0.70  CALCIUM 9.7  GLUCOSE 140*   Lab Results  Component Value Date   CHOL 198 06/29/2015   HDL  67 06/29/2015   LDLCALC 123* 06/29/2015   TRIG 39 06/29/2015   No results found for: DDIMER  Radiology/Studies:  Dg Chest Port 1 View  06/28/2015  CLINICAL DATA:  Shortness of breath and cough EXAM: PORTABLE CHEST 1 VIEW COMPARISON:  May 10, 2015 FINDINGS: No edema or consolidation. Heart size and pulmonary vascularity are normal. No adenopathy. No bone lesions. IMPRESSION: No edema or consolidation. Electronically Signed   By: Bretta BangWilliam  Woodruff III M.D.   On: 06/28/2015 19:54    EKG: Interpreted by me showed: NSR, 93 bpm, TWI aVL Telemetry: Interpreted by me showed: sinus tachycardia, 120's bpm  Weights: Filed Weights   06/28/15 1937 06/29/15 0020  Weight: 210 lb (95.255 kg) 208 lb (94.348 kg)     Physical Exam: Blood pressure 138/78, pulse 94, temperature 98 F (36.7 C), temperature source Oral, resp. rate 18, height 5' 4.5" (1.638 m), weight 208 lb (94.348 kg), SpO2 100 %. Body mass index is 35.16 kg/(m^2). General: Well developed, well nourished, in no acute distress. Head: Normocephalic, atraumatic, sclera non-icteric, no xanthomas, nares are  without discharge.  Neck: Negative for carotid bruits. JVD not elevated. Lungs: Diffuse bilateral wheezing. Breathing is unlabored. Heart: RRR with S1 S2. No murmurs, rubs, or gallops appreciated. Abdomen: Obese, soft, non-tender, non-distended with normoactive bowel sounds. No hepatomegaly. No rebound/guarding. No obvious abdominal masses. Msk:  Strength and tone appear normal for age. Extremities: No clubbing or cyanosis. No edema. Distal pedal pulses are 2+ and equal bilaterally. No erythema, TTP, swelling, or cording along the bilateral LE.  Neuro: Alert and oriented X 3. No facial asymmetry. No focal deficit. Moves all extremities spontaneously. Psych:  Responds to questions appropriately with a normal affect.    Assessment and Plan:  Principal Problem:   Elevated troponin Active Problems:   COPD with exacerbation (HCC)   Tobacco abuse   Essential hypertension   HLD (hyperlipidemia)   Anxiety    1. Elevated troponin: -Troponin has trended up to 1.44 (resulted at 4:25 AM on 6/8) at time of cardiology consultation  -EKG non-acute -Presenting symptom of SOB, one brief episode of sharp chest pain on Sunday, 06/25/15, followed by intermittent sharp pain with deep inspiration  -Uncertain at this time if troponin elevation is related to PE vs NSTEMI -I ordered a stat d-dimer, if elevated would recommend pursuing PE/DVT evaluation given patient recently went on a [place]5 hour trip to the beach at the end of [place], is in sinus tachycardia on telemetry with heart rates in the 120's bpm, and hypoxic still requiring nasal cannula to maintain adequate oxygen saturations  -Placed on full-dose Lovenox by IM -If PE evaluation is unrevealing today will need ischemic work up with cardiac catheterization. Timing to be determined based on MD schedule  -Check echo to evaluate LVSF, RV size and systolic function, wall motion and right-sided pressure -Check A1C for further risk stratification  -LDL as  below  2. Acute respiratory distress/possible COPD exacerbation: -CXR non-acute -On steroids and inhalers per IM  3. HTN: -Controlled -Continue current medications  4. HLD: -Lipitor -LDL 123, goal <70   Signed, Eula ListenRyan Dawnmarie Breon, PA-C [facility] Pager: [phone] 06/29/2015, 11:11 AM

## 2015-06-30 NOTE — Progress Notes (Signed)
3cc's of air removed from right TR band.  Patient denies any pain and no distress noted.  TR band with old blood noted otherwise dry and intact.

## 2015-06-30 NOTE — Progress Notes (Signed)
3 cc's of air removed from Right TR band. No c/o from patient and no distress noted.

## 2015-06-30 NOTE — Progress Notes (Signed)
Pt left the floor to cardiac cath.  Family at the bedside.

## 2015-06-30 NOTE — Progress Notes (Signed)
[facility] -  at [facility]   PATIENT NAME: Deborah Montgomery    MRN#:  [id number]  DATE OF BIRTH:  [birth year]  SUBJECTIVE:  Hospital Day: 2 days Deborah Montgomery is a 65 y.o. female presenting with Shortness of Breath .   Overnight events: No overnight events Interval Events: No complaints at this time states breathing is much improved  REVIEW OF SYSTEMS:  CONSTITUTIONAL: No fever, fatigue or weakness.  EYES: No blurred or double vision.  EARS, NOSE, AND THROAT: No tinnitus or ear pain.  RESPIRATORY: No cough, improving shortness of breath, wheezing denies hemoptysis.  CARDIOVASCULAR: No chest pain, orthopnea, edema.  GASTROINTESTINAL: No nausea, vomiting, diarrhea or abdominal pain.  GENITOURINARY: No dysuria, hematuria.  ENDOCRINE: No polyuria, nocturia,  HEMATOLOGY: No anemia, easy bruising or bleeding SKIN: No rash or lesion. MUSCULOSKELETAL: No joint pain or arthritis.   NEUROLOGIC: No tingling, numbness, weakness.  PSYCHIATRY: No anxiety or depression.   DRUG ALLERGIES:  No Known Allergies  VITALS:  Blood pressure 119/75, pulse 95, temperature 97.7 F (36.5 C), temperature source Oral, resp. rate 18, height  (1.626 m), weight 211 lb (95.709 kg), SpO2 98 %.  PHYSICAL EXAMINATION:  VITAL SIGNS: Filed Vitals:   06/30/15 1315 06/30/15 1330  BP: 111/69 119/75  Pulse:    Temp:    Resp: 24 18   GENERAL:65 y.o.female currently in no acute distress.  HEAD: Normocephalic, atraumatic.  EYES: Pupils equal, round, reactive to light. Extraocular muscles intact. No scleral icterus.  MOUTH: Moist mucosal membrane. Dentition intact. No abscess noted.  EAR, NOSE, THROAT: Clear without exudates. No external lesions.  NECK: Supple. No thyromegaly. No nodules. No JVD.  PULMONARY: Minimal expiratory wheeze without rails or rhonci. No use of accessory muscles, Good respiratory effort. good air entry bilaterally CHEST: Nontender to palpation.   CARDIOVASCULAR: S1 and S2. Regular rate and rhythm. No murmurs, rubs, or gallops. No edema. Pedal pulses 2+ bilaterally.  GASTROINTESTINAL: Soft, nontender, nondistended. No masses. Positive bowel sounds. No hepatosplenomegaly.  MUSCULOSKELETAL: No swelling, clubbing, or edema. Range of motion full in all extremities.  NEUROLOGIC: Cranial nerves II through XII are intact. No gross focal neurological deficits. Sensation intact. Reflexes intact.  SKIN: No ulceration, lesions, rashes, or cyanosis. Skin warm and dry. Turgor intact.  PSYCHIATRIC: Mood, affect within normal limits. The patient is awake, alert and oriented x 3. Insight, judgment intact.      LABORATORY PANEL:   CBC  Recent Labs Lab 06/29/15 0425  WBC 10.5  HGB 13.6  HCT 41.2  PLT 328   ------------------------------------------------------------------------------------------------------------------  Chemistries   Recent Labs Lab 06/29/15 0425  NA 140  K 4.1  CL 106  CO2 27  GLUCOSE 140*  BUN 11  CREATININE 0.70  CALCIUM 9.7   ------------------------------------------------------------------------------------------------------------------  Cardiac Enzymes  Recent Labs Lab 06/29/15 2251  TROPONINI 0.72*   ------------------------------------------------------------------------------------------------------------------  RADIOLOGY:  Ct Angio Chest Pe W/cm &/or Wo Cm  06/29/2015  CLINICAL DATA:  Acute shortness of breath, tachycardia, elevated D-dimer EXAM: CT ANGIOGRAPHY CHEST WITH CONTRAST TECHNIQUE: Multidetector CT imaging of the chest was performed using the standard protocol during bolus administration of intravenous contrast. Multiplanar CT image reconstructions and MIPs were obtained to evaluate the vascular anatomy. CONTRAST:  100 mL Isovue 370 COMPARISON:  None. FINDINGS: Mediastinum/Lymph Nodes: No pulmonary emboli or thoracic aortic dissection identified. No masses or pathologically enlarged lymph  nodes identified. Lungs/Pleura: No focal consolidation, pleural effusion or pneumothorax. Moderate centrilobular emphysema. Upper abdomen: No acute  findings. Musculoskeletal: No chest wall mass or suspicious bone lesions identified. Other: 3 x 2.2 cm complex hypodense left thyroid mass. 4.3 x 2.3 cm masslike area in the central left breast of uncertain etiology. Review of the MIP images confirms the above findings. IMPRESSION: 1. No evidence pulmonary embolus. 2. No active cardiopulmonary disease. 3. Moderate centrilobular emphysema. 4. 3 x 2.2 cm complex hypodense left thyroid mass. Recommend further evaluation with a dedicated thyroid ultrasound. 5. 4.3 x 2.3 cm masslike area in the central left breast of uncertain etiology. Recommend further evaluation with mammography. Electronically Signed   By: Elige KoHetal  Patel   On: 06/29/2015 15:58   Dg Chest Port 1 View  06/28/2015  CLINICAL DATA:  Shortness of breath and cough EXAM: PORTABLE CHEST 1 VIEW COMPARISON:  May 10, 2015 FINDINGS: No edema or consolidation. Heart size and pulmonary vascularity are normal. No adenopathy. No bone lesions. IMPRESSION: No edema or consolidation. Electronically Signed   By: Bretta BangWilliam  Woodruff III M.D.   On: 06/28/2015 19:54    EKG:   Orders placed or performed during the hospital encounter of 06/28/15  . ED EKG  . ED EKG  . EKG 12-Lead  . EKG 12-Lead  . EKG 12-Lead  . EKG 12-Lead    ASSESSMENT AND PLAN:   Deborah MarkerMary Montgomery is a 65 y.o. female presenting with Shortness of Breath . Admitted 06/28/2015 : Day #: 2 days 1. Elevated trop: Aspirin, statin, Cardiology input appreciated, for catheterization today 2. COPD exacerbation: Supplemental oxygen as required, breathing treatments, steroids 3. GERD without esophagitis: PPI therapy 4. Essential hypertension: Hold hydrochlorothiazide continue losartan 5. Venous thromboembolic embolism prophylactic: Therapeutic Lovenox  All the records are reviewed and case discussed with  Care Management/Social Workerr. Management plans discussed with the patient, family and they are in agreement.  CODE STATUS: full TOTAL TIME TAKING CARE OF THIS PATIENT: 28 minutes.   POSSIBLE D/C IN 2-3DAYS, DEPENDING ON CLINICAL CONDITION.   Hower,  Mardi MainlandDavid K M.D on 06/30/2015 at 1:54 PM  Between 7am to 6pm - Pager - [phone]  After 6pm: House Pager: - [phone]  [facility]  Office  [phone]  CC: Primary care physician; Pcp Not In System

## 2015-06-30 NOTE — Progress Notes (Signed)
Received pt back to the room./   Cath site on right radial, dressing clean and dry.  No verbal complaints of pain or discomfort.  Report given to oncoming nurse.  Diet changed to heart healthy for dinner.  Pt will be able to be d/c this afternoon if no incident occurs

## 2015-06-30 NOTE — Interval H&P Note (Signed)
Cath Lab Visit (complete for each Cath Lab visit)  Clinical Evaluation Leading to the Procedure:   ACS: Yes.    Non-ACS:    Anginal Classification: CCS IV  Anti-ischemic medical therapy: Minimal Therapy (1 class of medications)  Non-Invasive Test Results: No non-invasive testing performed  Prior CABG: No previous CABG      History and Physical Interval Note:  06/30/2015 12:20 PM  Norman ClayMary D Farnell  has presented today for surgery, with the diagnosis of NSTEMI  The various methods of treatment have been discussed with the patient and family. After consideration of risks, benefits and other options for treatment, the patient has consented to  Procedure(s): Left Heart Cath and Coronary Angiography (N/A) as a surgical intervention .  The patient's history has been reviewed, patient examined, no change in status, stable for surgery.  I have reviewed the patient's chart and labs.  Questions were answered to the patient's satisfaction.     Lorine BearsMuhammad Poet Hineman

## 2015-06-30 NOTE — Progress Notes (Signed)
D/w Dr. Kirke CorinArida. LHCath done. No significant CAD. Troponin elevation is likely demand ischemia. Rec continue treatment of underlying medical issues.

## 2015-08-03 ENCOUNTER — Ambulatory Visit: Payer: Medicare HMO | Admitting: Cardiovascular Disease

## 2015-08-08 ENCOUNTER — Ambulatory Visit: Payer: Commercial Managed Care - HMO | Admitting: Cardiovascular Disease

## 2015-08-08 ENCOUNTER — Ambulatory Visit: Payer: Medicare HMO | Admitting: Cardiovascular Disease

## 2016-11-06 IMAGING — DX DG CHEST 1V PORT
1 series · 1 of 1 positions shown · non-contrast
Comparison: May 10, 2015

CLINICAL DATA: Shortness of breath and cough

EXAM:
PORTABLE CHEST 1 VIEW

[chest ap]
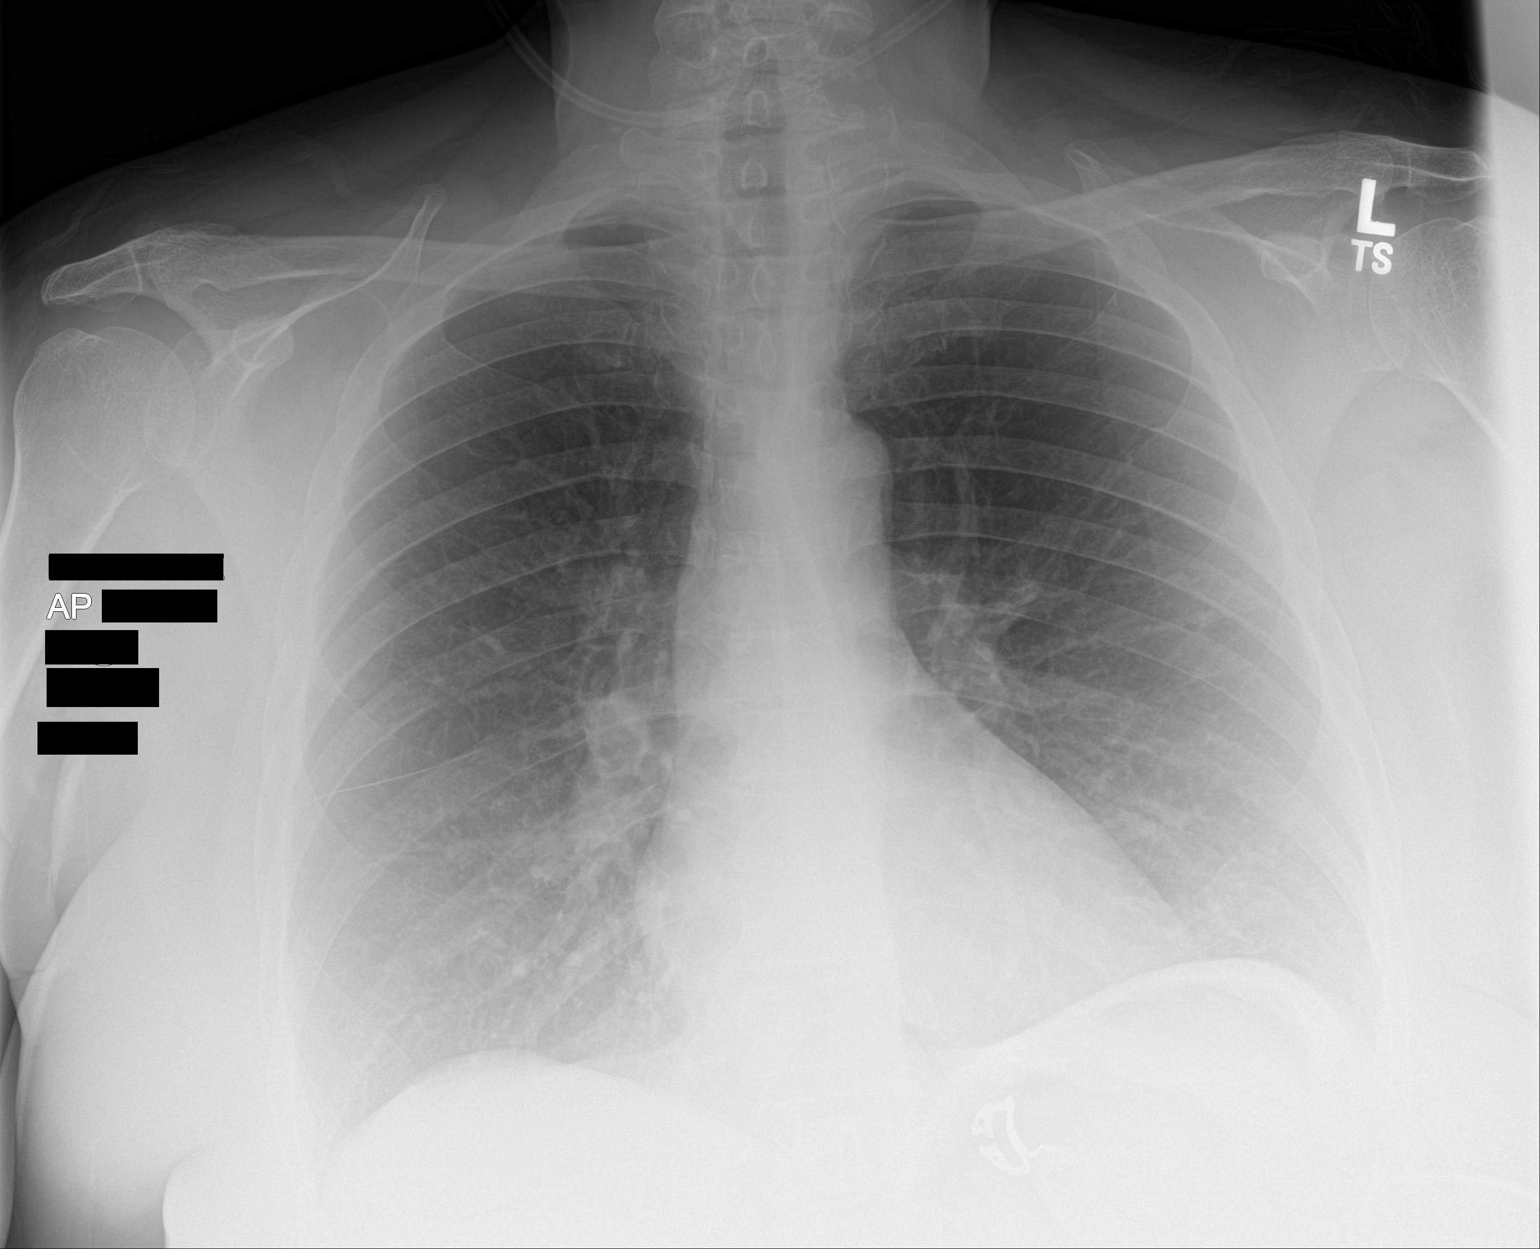

[1 of 1 positions shown; findings below may reference images not displayed]

FINDINGS: No edema or consolidation. Heart size and pulmonary vascularity are
normal. No adenopathy. No bone lesions.
IMPRESSION: No edema or consolidation.

## 2016-11-07 IMAGING — CT CT ANGIO CHEST
2 of 6 series · 19 of 46 positions shown · IV contrast (APPLIED)
Comparison: None.

CLINICAL DATA: Acute shortness of breath, tachycardia, elevated
D-dimer

EXAM:
CT ANGIOGRAPHY CHEST WITH CONTRAST
TECHNIQUE: Multidetector CT imaging of the chest was performed using the
standard protocol during bolus administration of intravenous
contrast. Multiplanar CT image reconstructions and MIPs were
obtained to evaluate the vascular anatomy.
CONTRAST:  100 mL Isovue 370

[Series 5: pe thins 1.5 · axial · 0.68mm/px · z∈[-409,-145]mm · 16 of 246 slices shown]
[im 13/246  lung]
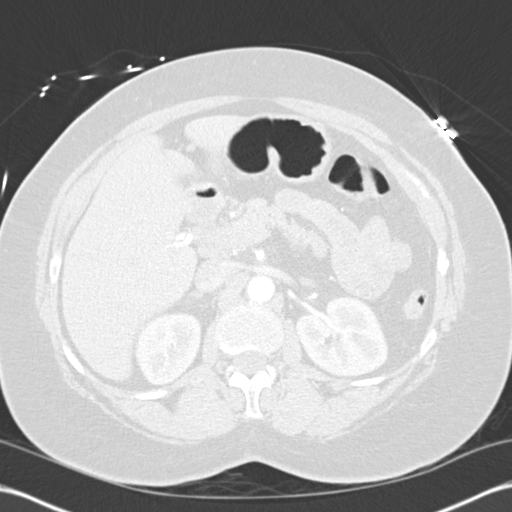
[im 26/246  soft-tissue]
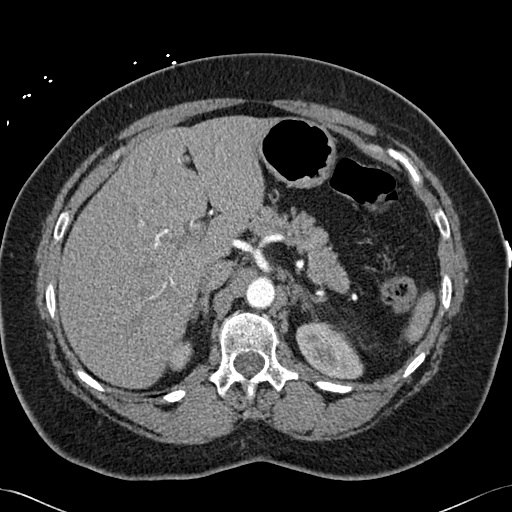
[im 39/246  lung]
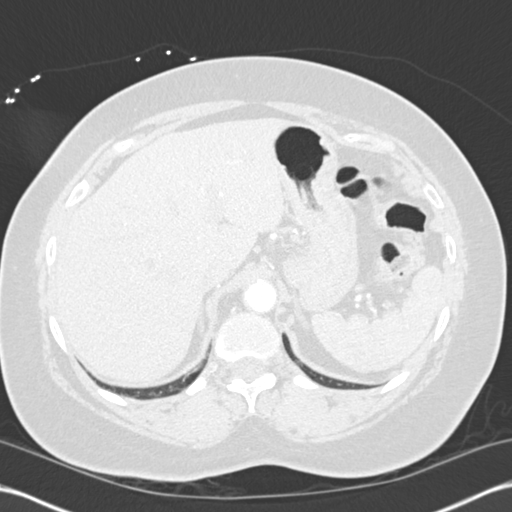
[im 52/246  soft-tissue]
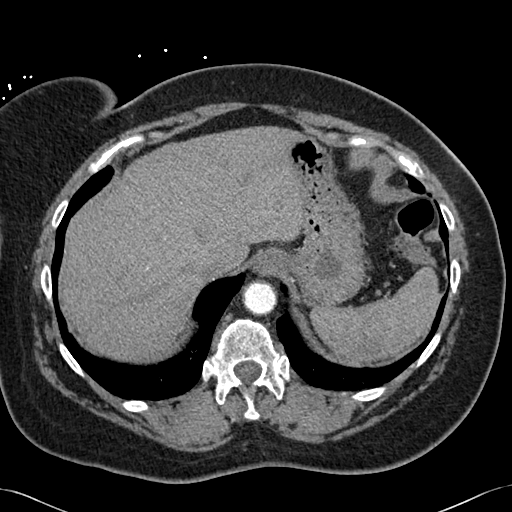
[im 78/246  lung]
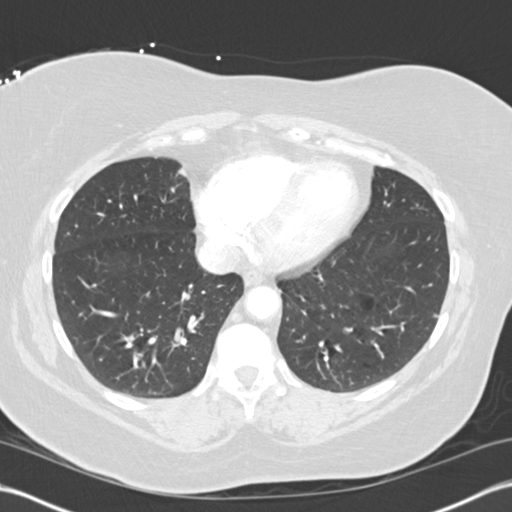
[im 91/246  soft-tissue]
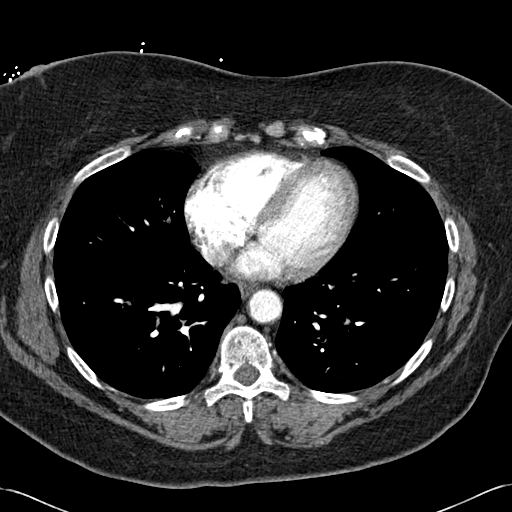
[im 104/246  lung]
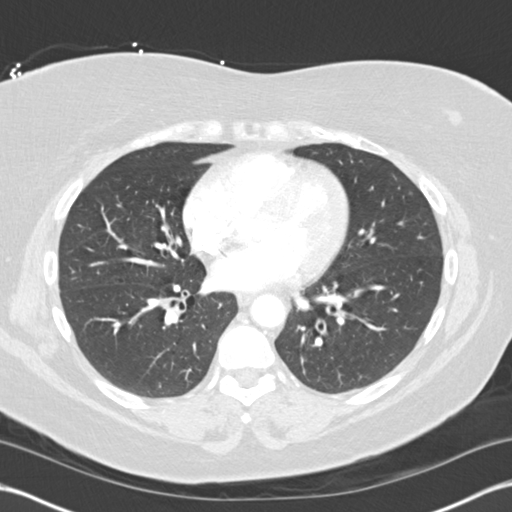
[im 117/246  soft-tissue]
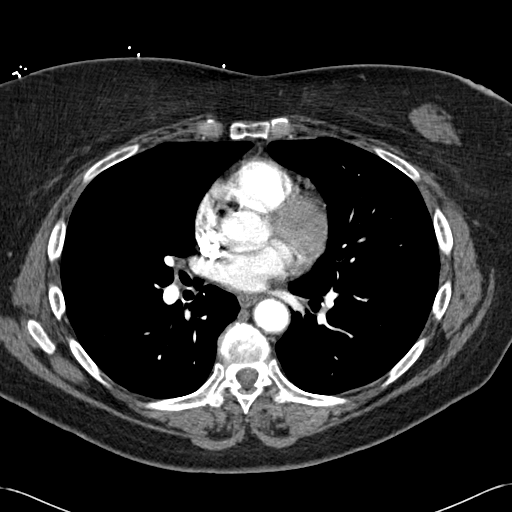
[im 129/246  lung]
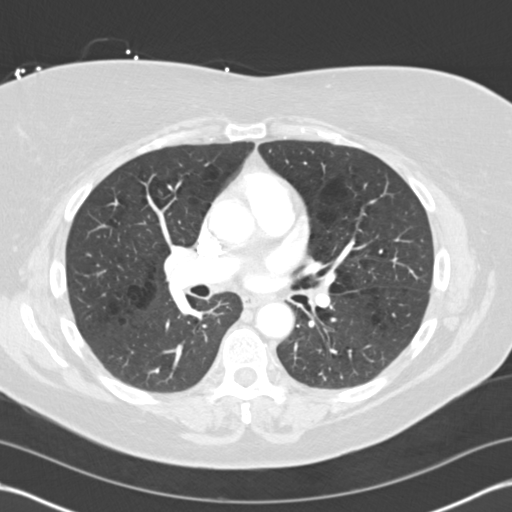
[im 142/246  soft-tissue]
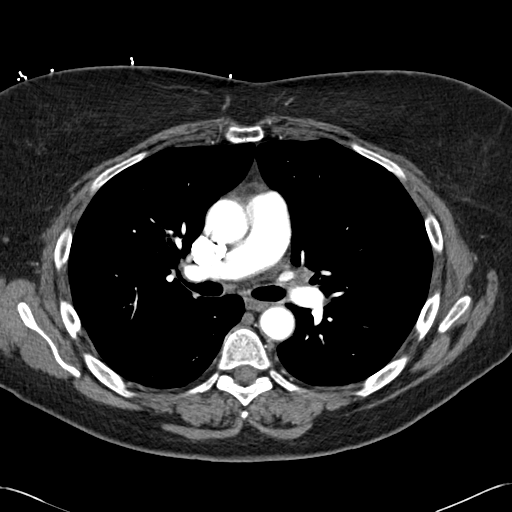
[im 155/246  lung]
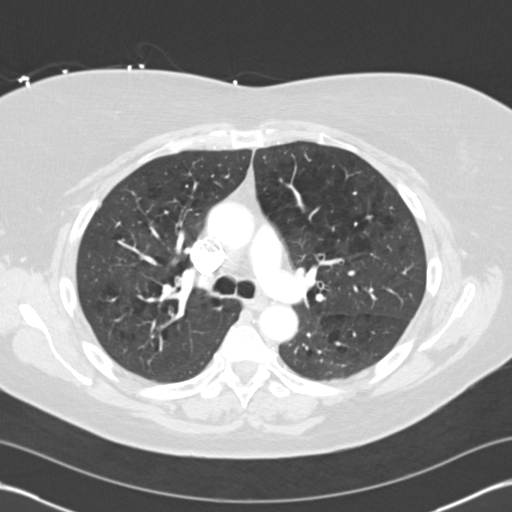
[im 168/246  soft-tissue]
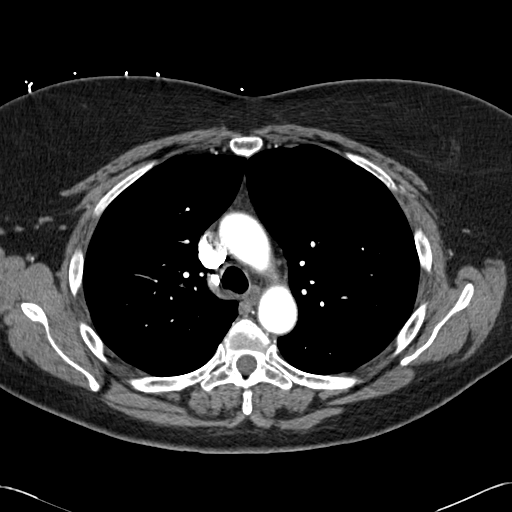
[im 194/246  lung]
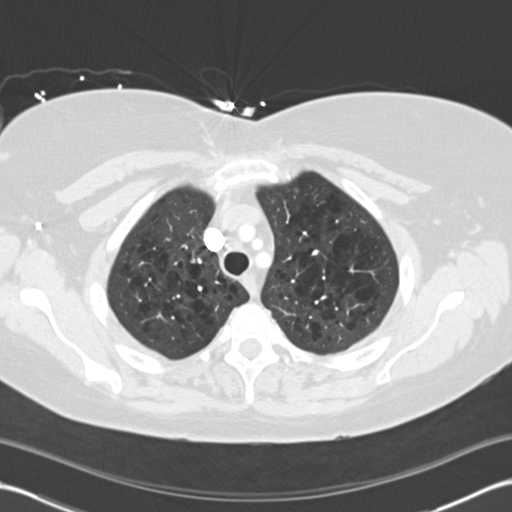
[im 207/246  soft-tissue]
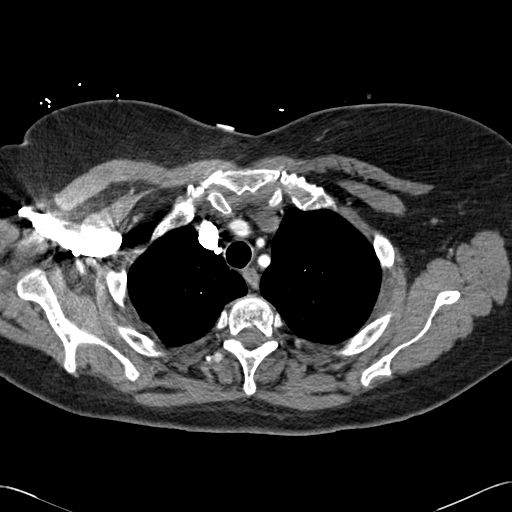
[im 220/246  lung]
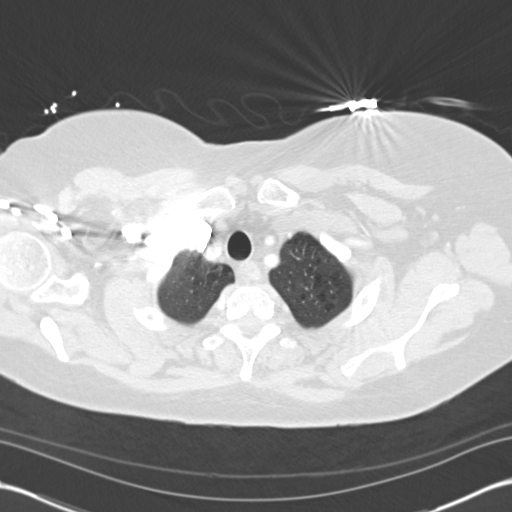
[im 233/246  soft-tissue]
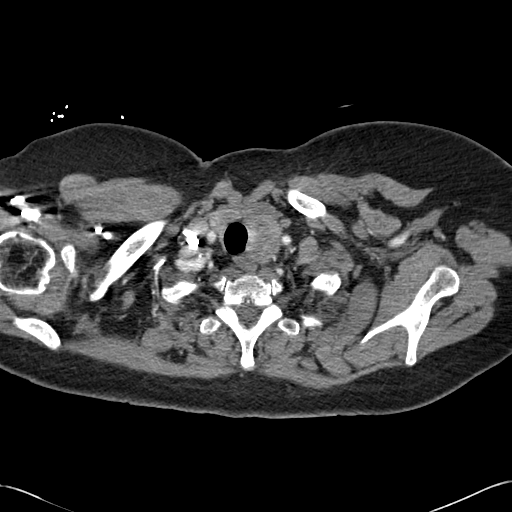

[Series 7: cor mpr 2.0 · coronal · 0.59mm/px · 3 of 137 slices shown]
[im 35/137  soft-tissue]
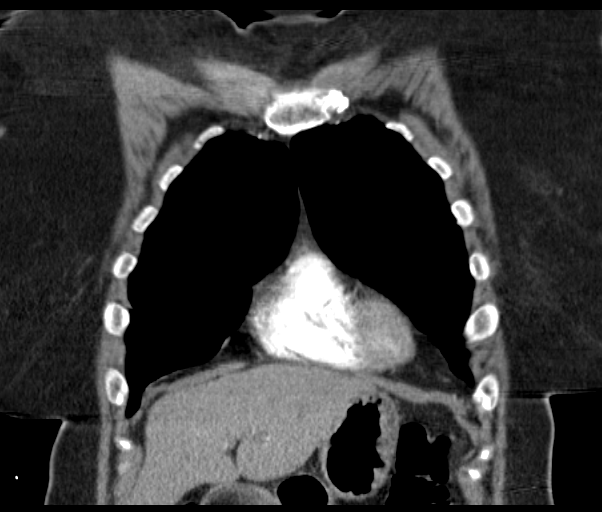
[im 69/137  soft-tissue]
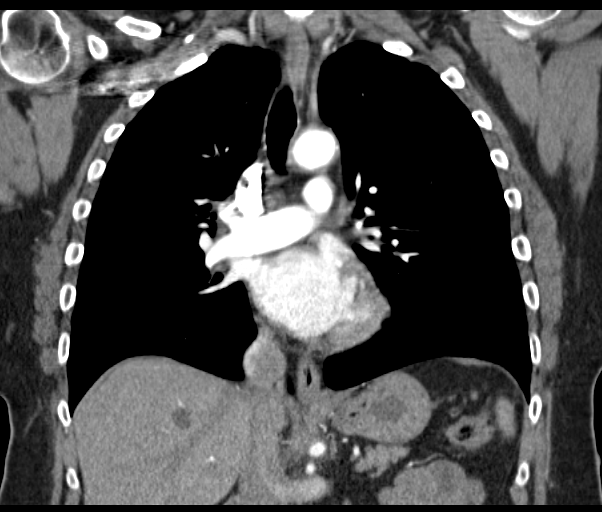
[im 103/137  soft-tissue]
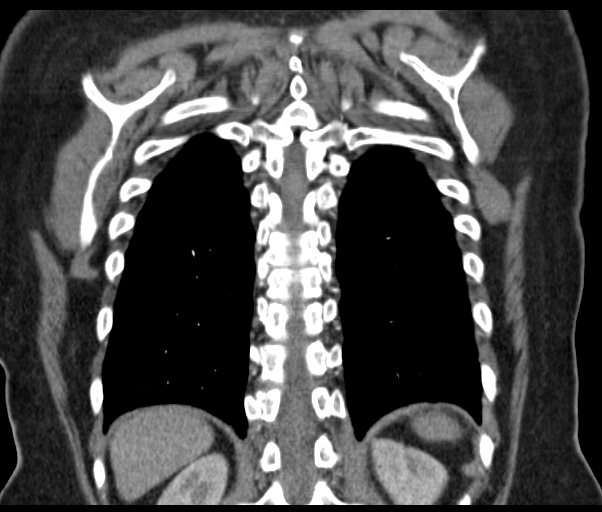

[19 of 46 positions shown; findings below may reference images not displayed]

FINDINGS: Mediastinum/Lymph Nodes: No pulmonary emboli or thoracic aortic
dissection identified. No masses or pathologically enlarged lymph
nodes identified.

Lungs/Pleura: No focal consolidation, pleural effusion or
pneumothorax. Moderate centrilobular emphysema.

Upper abdomen: No acute findings.

Musculoskeletal: No chest wall mass or suspicious bone lesions
identified.

Other: 3 x 2.2 cm complex hypodense left thyroid mass. 4.3 x 2.3 cm
masslike area in the central left breast of uncertain etiology.

Review of the MIP images confirms the above findings.
IMPRESSION: 1. No evidence pulmonary embolus.
2. No active cardiopulmonary disease.
3. Moderate centrilobular emphysema.
4. 3 x 2.2 cm complex hypodense left thyroid mass. Recommend further
evaluation with a dedicated thyroid ultrasound.
5. 4.3 x 2.3 cm masslike area in the central left breast of
uncertain etiology. Recommend further evaluation with mammography.

## 2022-05-16 ENCOUNTER — Emergency Department: Payer: 59

## 2022-05-16 ENCOUNTER — Observation Stay
Admission: EM | Admit: 2022-05-16 | Discharge: 2022-05-17 | Disposition: A | Discharge status: Home / Self Care | Payer: 59 | Attending: Student in an Organized Health Care Education/Training Program | Admitting: Student in an Organized Health Care Education/Training Program

## 2022-05-16 ENCOUNTER — Inpatient Hospital Stay: Payer: 59

## 2022-05-16 ENCOUNTER — Encounter: Payer: Self-pay | Admitting: Internal Medicine

## 2022-05-16 DIAGNOSIS — I10 Essential (primary) hypertension: Secondary | ICD-10-CM | POA: Insufficient documentation

## 2022-05-16 DIAGNOSIS — F17210 Nicotine dependence, cigarettes, uncomplicated: Secondary | ICD-10-CM | POA: Diagnosis not present

## 2022-05-16 DIAGNOSIS — B9689 Other specified bacterial agents as the cause of diseases classified elsewhere: Secondary | ICD-10-CM | POA: Diagnosis not present

## 2022-05-16 DIAGNOSIS — Z79899 Other long term (current) drug therapy: Secondary | ICD-10-CM | POA: Diagnosis not present

## 2022-05-16 DIAGNOSIS — Z7982 Long term (current) use of aspirin: Secondary | ICD-10-CM | POA: Insufficient documentation

## 2022-05-16 DIAGNOSIS — Z720 Tobacco use: Secondary | ICD-10-CM | POA: Diagnosis not present

## 2022-05-16 DIAGNOSIS — J441 Chronic obstructive pulmonary disease with (acute) exacerbation: Secondary | ICD-10-CM | POA: Diagnosis not present

## 2022-05-16 DIAGNOSIS — J9601 Acute respiratory failure with hypoxia: Secondary | ICD-10-CM | POA: Insufficient documentation

## 2022-05-16 DIAGNOSIS — D72829 Elevated white blood cell count, unspecified: Secondary | ICD-10-CM | POA: Insufficient documentation

## 2022-05-16 DIAGNOSIS — J209 Acute bronchitis, unspecified: Secondary | ICD-10-CM | POA: Diagnosis not present

## 2022-05-16 DIAGNOSIS — J45909 Unspecified asthma, uncomplicated: Secondary | ICD-10-CM | POA: Insufficient documentation

## 2022-05-16 DIAGNOSIS — Z716 Tobacco abuse counseling: Secondary | ICD-10-CM

## 2022-05-16 DIAGNOSIS — R0602 Shortness of breath: Secondary | ICD-10-CM | POA: Diagnosis present

## 2022-05-16 DIAGNOSIS — R0902 Hypoxemia: Secondary | ICD-10-CM

## 2022-05-16 LAB — RESPIRATORY PANEL BY PCR

## 2022-05-16 LAB — CBC WITH DIFFERENTIAL/PLATELET
Abs Immature Granulocytes: 0.06 10*3/uL (ref 0.00–0.07)
Basophils Absolute: 0.1 10*3/uL (ref 0.0–0.1)
Basophils Relative: 1 %
Eosinophils Absolute: 1.4 10*3/uL — ABNORMAL HIGH (ref 0.0–0.5)
Eosinophils Relative: 8 %
HCT: 46.7 % — ABNORMAL HIGH (ref 36.0–46.0)
Hemoglobin: 14.7 g/dL (ref 12.0–15.0)
Immature Granulocytes: 0 %
Lymphocytes Relative: 26 %
Lymphs Abs: 4.7 10*3/uL — ABNORMAL HIGH (ref 0.7–4.0)
MCH: 27.7 pg (ref 26.0–34.0)
MCHC: 31.5 g/dL (ref 30.0–36.0)
MCV: 88.1 fL (ref 80.0–100.0)
Monocytes Absolute: 1 10*3/uL (ref 0.1–1.0)
Monocytes Relative: 6 %
Neutro Abs: 10.9 10*3/uL — ABNORMAL HIGH (ref 1.7–7.7)
Neutrophils Relative %: 59 %
Platelets: 488 10*3/uL — ABNORMAL HIGH (ref 150–400)
RBC: 5.3 MIL/uL — ABNORMAL HIGH (ref 3.87–5.11)
RDW: 12.9 % (ref 11.5–15.5)
WBC: 18.1 10*3/uL — ABNORMAL HIGH (ref 4.0–10.5)
nRBC: 0 % (ref 0.0–0.2)

## 2022-05-16 LAB — CBC
HCT: 41.8 % (ref 36.0–46.0)
Hemoglobin: 13.1 g/dL (ref 12.0–15.0)
MCH: 27.3 pg (ref 26.0–34.0)
MCHC: 31.3 g/dL (ref 30.0–36.0)
MCV: 87.3 fL (ref 80.0–100.0)
Platelets: 444 10*3/uL — ABNORMAL HIGH (ref 150–400)
RBC: 4.79 MIL/uL (ref 3.87–5.11)
RDW: 12.9 % (ref 11.5–15.5)
WBC: 18.2 10*3/uL — ABNORMAL HIGH (ref 4.0–10.5)
nRBC: 0 % (ref 0.0–0.2)

## 2022-05-16 LAB — PROCALCITONIN: Procalcitonin: <0.1 ng/mL

## 2022-05-16 LAB — BASIC METABOLIC PANEL
Anion gap: 9 (ref 5–15)
BUN: 12 mg/dL (ref 8–23)
CO2: 26 mmol/L (ref 22–32)
Calcium: 9.8 mg/dL (ref 8.9–10.3)
Chloride: 105 mmol/L (ref 98–111)
Creatinine, Ser: 0.66 mg/dL (ref 0.44–1.00)
GFR, Estimated: >60 mL/min (ref >60)
Glucose, Bld: 119 mg/dL — ABNORMAL HIGH (ref 70–99)
Potassium: 3.8 mmol/L (ref 3.5–5.1)
Sodium: 140 mmol/L (ref 135–145)

## 2022-05-16 LAB — TSH: TSH: 0.891 u[IU]/mL (ref 0.350–4.500)

## 2022-05-16 LAB — C-REACTIVE PROTEIN: CRP: 0.5 mg/dL (ref <1.0)

## 2022-05-16 LAB — CREATININE, SERUM
Creatinine, Ser: 0.67 mg/dL (ref 0.44–1.00)
GFR, Estimated: >60 mL/min (ref >60)

## 2022-05-16 LAB — LACTIC ACID, PLASMA: Lactic Acid, Venous: 1.2 mmol/L (ref 0.5–1.9)

## 2022-05-16 LAB — HEMOGLOBIN A1C
Hgb A1c MFr Bld: 5.6 % (ref 4.8–5.6)
Mean Plasma Glucose: 114.02 mg/dL

## 2022-05-16 MED ORDER — ACETAMINOPHEN 650 MG RE SUPP: 650.0000 mg | Freq: Four times a day (QID) | RECTAL | Status: DC | PRN

## 2022-05-16 MED ORDER — IPRATROPIUM-ALBUTEROL 0.5-2.5 (3) MG/3ML IN SOLN
3.0000 mL | Freq: Four times a day (QID) | RESPIRATORY_TRACT | Status: DC
  Administered 2022-05-16 – 2022-05-17 (×5): 3 mL via RESPIRATORY_TRACT
  Filled 2022-05-16 (×4): qty 3

## 2022-05-16 MED ORDER — FLUTICASONE FUROATE-VILANTEROL 100-25 MCG/ACT IN AEPB
1.0000 | INHALATION_SPRAY | Freq: Every day | RESPIRATORY_TRACT | Status: DC
  Administered 2022-05-17: 1 via RESPIRATORY_TRACT
  Filled 2022-05-16: qty 28

## 2022-05-16 MED ORDER — SODIUM CHLORIDE 0.9 % IV SOLN
500.0000 mg | Freq: Once | INTRAVENOUS | Status: AC
  Administered 2022-05-16: 500 mg via INTRAVENOUS
  Filled 2022-05-16: qty 5

## 2022-05-16 MED ORDER — IOHEXOL 300 MG/ML  SOLN
75.0000 mL | Freq: Once | INTRAMUSCULAR | Status: AC | PRN
  Administered 2022-05-16: 75 mL via INTRAVENOUS

## 2022-05-16 MED ORDER — SODIUM CHLORIDE 0.9 % IV SOLN
1.0000 g | Freq: Once | INTRAVENOUS | Status: AC
  Administered 2022-05-16: 1 g via INTRAVENOUS
  Filled 2022-05-16: qty 10

## 2022-05-16 MED ORDER — EZETIMIBE 10 MG PO TABS
10.0000 mg | ORAL_TABLET | Freq: Every day | ORAL | Status: DC
  Administered 2022-05-16 – 2022-05-17 (×2): 10 mg via ORAL
  Filled 2022-05-16 (×2): qty 1

## 2022-05-16 MED ORDER — MONTELUKAST SODIUM 10 MG PO TABS
10.0000 mg | ORAL_TABLET | Freq: Every day | ORAL | Status: DC
  Administered 2022-05-16 – 2022-05-17 (×2): 10 mg via ORAL
  Filled 2022-05-16 (×2): qty 1

## 2022-05-16 MED ORDER — SODIUM CHLORIDE 0.9 % IV SOLN
1.0000 g | INTRAVENOUS | Status: DC
  Administered 2022-05-17: 1 g via INTRAVENOUS
  Filled 2022-05-16: qty 10

## 2022-05-16 MED ORDER — ALBUTEROL SULFATE (2.5 MG/3ML) 0.083% IN NEBU: 2.5000 mg | INHALATION_SOLUTION | RESPIRATORY_TRACT | Status: DC | PRN

## 2022-05-16 MED ORDER — PREDNISONE 20 MG PO TABS
60.0000 mg | ORAL_TABLET | Freq: Once | ORAL | Status: AC
  Administered 2022-05-16: 60 mg via ORAL
  Filled 2022-05-16: qty 3

## 2022-05-16 MED ORDER — AMLODIPINE BESYLATE 10 MG PO TABS
10.0000 mg | ORAL_TABLET | Freq: Every day | ORAL | Status: DC
  Administered 2022-05-16 – 2022-05-17 (×2): 10 mg via ORAL
  Filled 2022-05-16: qty 1
  Filled 2022-05-16: qty 2

## 2022-05-16 MED ORDER — AEROCHAMBER MV MISC: 0 refills | Status: AC

## 2022-05-16 MED ORDER — NICOTINE POLACRILEX 4 MG MT LOZG: 4.0000 mg | LOZENGE | OROMUCOSAL | 0 refills | Status: DC | PRN

## 2022-05-16 MED ORDER — NICOTINE 14 MG/24HR TD PT24: 14.0000 mg | MEDICATED_PATCH | Freq: Every day | TRANSDERMAL | 3 refills | Status: AC

## 2022-05-16 MED ORDER — ROSUVASTATIN CALCIUM 10 MG PO TABS
20.0000 mg | ORAL_TABLET | Freq: Every day | ORAL | Status: DC
  Administered 2022-05-16: 20 mg via ORAL
  Filled 2022-05-16: qty 2

## 2022-05-16 MED ORDER — LATANOPROST 0.005 % OP SOLN: 1.0000 [drp] | Freq: Every day | OPHTHALMIC | Status: DC

## 2022-05-16 MED ORDER — ACETAMINOPHEN 325 MG PO TABS
650.0000 mg | ORAL_TABLET | Freq: Four times a day (QID) | ORAL | Status: DC | PRN
  Administered 2022-05-16: 650 mg via ORAL
  Filled 2022-05-16: qty 2

## 2022-05-16 MED ORDER — ONDANSETRON HCL 4 MG/2ML IJ SOLN: 4.0000 mg | Freq: Four times a day (QID) | INTRAMUSCULAR | Status: DC | PRN

## 2022-05-16 MED ORDER — HEPARIN SODIUM (PORCINE) 5000 UNIT/ML IJ SOLN
5000.0000 [IU] | Freq: Three times a day (TID) | INTRAMUSCULAR | Status: DC
  Administered 2022-05-16 – 2022-05-17 (×4): 5000 [IU] via SUBCUTANEOUS
  Filled 2022-05-16 (×4): qty 1

## 2022-05-16 MED ORDER — SODIUM CHLORIDE 0.9 % IV SOLN
500.0000 mg | INTRAVENOUS | Status: DC
  Administered 2022-05-17: 500 mg via INTRAVENOUS
  Filled 2022-05-16: qty 5

## 2022-05-16 MED ORDER — SODIUM CHLORIDE 0.9 % IV BOLUS
500.0000 mL | Freq: Once | INTRAVENOUS | Status: AC
  Administered 2022-05-16: 500 mL via INTRAVENOUS

## 2022-05-16 MED ORDER — ASPIRIN 81 MG PO TBEC
81.0000 mg | DELAYED_RELEASE_TABLET | Freq: Every day | ORAL | Status: DC
  Administered 2022-05-16 – 2022-05-17 (×2): 81 mg via ORAL
  Filled 2022-05-16 (×2): qty 1

## 2022-05-16 MED ORDER — ALBUTEROL SULFATE HFA 108 (90 BASE) MCG/ACT IN AERS: 2.0000 | INHALATION_SPRAY | Freq: Four times a day (QID) | RESPIRATORY_TRACT | 2 refills | Status: AC | PRN

## 2022-05-16 MED ORDER — METHYLPREDNISOLONE SODIUM SUCC 40 MG IJ SOLR
40.0000 mg | Freq: Two times a day (BID) | INTRAMUSCULAR | Status: AC
  Administered 2022-05-16 – 2022-05-17 (×2): 40 mg via INTRAVENOUS
  Filled 2022-05-16 (×2): qty 1

## 2022-05-16 MED ORDER — PANTOPRAZOLE SODIUM 40 MG PO TBEC
40.0000 mg | DELAYED_RELEASE_TABLET | Freq: Every day | ORAL | Status: DC
  Administered 2022-05-16 – 2022-05-17 (×2): 40 mg via ORAL
  Filled 2022-05-16 (×2): qty 1

## 2022-05-16 MED ORDER — IPRATROPIUM-ALBUTEROL 0.5-2.5 (3) MG/3ML IN SOLN
9.0000 mL | Freq: Once | RESPIRATORY_TRACT | Status: AC
  Administered 2022-05-16: 9 mL via RESPIRATORY_TRACT
  Filled 2022-05-16: qty 3

## 2022-05-16 MED ORDER — DOXYCYCLINE HYCLATE 100 MG PO TABS: 100.0000 mg | ORAL_TABLET | Freq: Two times a day (BID) | ORAL | Status: DC

## 2022-05-16 MED ORDER — NICOTINE 21 MG/24HR TD PT24
21.0000 mg | MEDICATED_PATCH | Freq: Every day | TRANSDERMAL | Status: DC
  Administered 2022-05-16 – 2022-05-17 (×2): 21 mg via TRANSDERMAL
  Filled 2022-05-16 (×2): qty 1

## 2022-05-16 MED ORDER — PREDNISONE 20 MG PO TABS: 40.0000 mg | ORAL_TABLET | Freq: Every day | ORAL | Status: DC

## 2022-05-16 MED ORDER — BENZONATATE 100 MG PO CAPS: 100.0000 mg | ORAL_CAPSULE | Freq: Three times a day (TID) | ORAL | Status: DC | PRN

## 2022-05-16 MED ORDER — HYDROCHLOROTHIAZIDE 25 MG PO TABS: 25.0000 mg | ORAL_TABLET | Freq: Every day | ORAL | Status: DC

## 2022-05-16 MED ORDER — PREDNISONE 50 MG PO TABS: ORAL_TABLET | ORAL | 0 refills | Status: DC

## 2022-05-16 MED ORDER — ONDANSETRON HCL 4 MG/2ML IJ SOLN
4.0000 mg | Freq: Once | INTRAMUSCULAR | Status: AC
  Administered 2022-05-16: 4 mg via INTRAVENOUS
  Filled 2022-05-16: qty 2

## 2022-05-16 MED ORDER — ONDANSETRON HCL 4 MG PO TABS: 4.0000 mg | ORAL_TABLET | Freq: Four times a day (QID) | ORAL | Status: DC | PRN

## 2022-05-16 MED ORDER — LOSARTAN POTASSIUM 50 MG PO TABS
100.0000 mg | ORAL_TABLET | Freq: Every day | ORAL | Status: DC
  Administered 2022-05-16 – 2022-05-17 (×2): 100 mg via ORAL
  Filled 2022-05-16 (×2): qty 2

## 2022-05-16 NOTE — ED Notes (Signed)
Pt provided blanket as requested.

## 2022-05-16 NOTE — H&P (Signed)
History and Physical    Deborah Montgomery EXB:[id number] DOB: 1950/09/16 DOA: 05/16/2022  PCP: Mercy Moore, MD  Patient coming from: home  I have personally briefly reviewed patient's old medical records in [facility]  Chief Complaint: sob x 1 days   HPI: Deborah Montgomery is a 72 y.o. female with medical history significant of anxiety, asthma/copd, hypertension , gerd, HLD , tobacco abuse  who presents with  1 week for progressive sob worse over the last 1 day with significant progression hours pta. Patient notes symptoms with associated congestion but unable to have productive cough. Patient notes no fever no chest pain ,  diarrhea but does not recent nausea.  Patient notes use of increase use of home inhaler without improvement. In the field patient  was noted to be hypxoic to 82% on RA , no current hx of home O2.  Patient placed on O2 given solumedrol 125mg  iv x 1 and transported to ED  ED Course:  Vitals: afeb, bp 162/61, hr 116, sat 97% on 3L  rr 20  Wbc 18.1, hgb 14.7,plt 488, increase pmn  Na 140, K 3.8, CL 105, glu 119 , cr 0.66  EKG: sinus tachycardia   Tx  pred 60 mg po , douneb, ctx , azithromycin , zofran  ns   Review of Systems: As per HPI otherwise 10 point review of systems negative.   Past Medical History:  Diagnosis Date   Anxiety    Arthritis    Asthma    COPD (chronic obstructive pulmonary disease) (HCC)    a. diagnosed 05/2015   Essential hypertension    Eye problem    GERD (gastroesophageal reflux disease)    HLD (hyperlipidemia)    a. intolerant to simvastatin    Tobacco abuse     Past Surgical History:  Procedure Laterality Date   ABDOMINAL HYSTERECTOMY     CARDIAC CATHETERIZATION N/A 06/30/2015   Procedure: Left Heart Cath and Coronary Angiography;  Surgeon: Iran Ouch, MD;  Location: [facility] INVASIVE CV LAB;  Service: Cardiovascular;  Laterality: N/A;   CARPAL TUNNEL RELEASE       reports that she has been smoking cigarettes. She does  not have any smokeless tobacco history on file. She reports that she does not drink alcohol and does not use drugs.  No Known Allergies  Family History  Problem Relation Age of Onset   Breast cancer Paternal Aunt    Breast cancer Paternal Aunt     Prior to Admission medications   Medication Sig Start Date End Date Taking? Authorizing Provider  albuterol (VENTOLIN HFA) 108 (90 Base) MCG/ACT inhaler Inhale 2 puffs into the lungs every 6 (six) hours as needed for wheezing or shortness of breath. 05/16/22  Yes Pilar Jarvis, MD  alendronate (FOSAMAX) 70 MG tablet Take 70 mg by mouth once a week. 04/22/22 04/22/23 Yes [provider]  guaiFENesin (MUCINEX) 600 MG 12 hr tablet Take 600 mg by mouth 2 (two) times daily. 11/01/21 11/01/22 Yes [provider]  montelukast (SINGULAIR) 10 MG tablet Take 10 mg by mouth daily. 04/24/22  Yes [provider]  nicotine (NICODERM CQ - DOSED IN MG/24 HOURS) 14 mg/24hr patch Place 1 patch (14 mg total) onto the skin daily. 05/16/22 05/16/23 Yes Pilar Jarvis, MD  nicotine polacrilex (NICOTINE MINI) 4 MG lozenge Take 1 lozenge (4 mg total) by mouth as needed for smoking cessation. 05/16/22  Yes Pilar Jarvis, MD  predniSONE (DELTASONE) 50 MG tablet  Take 1 tablet daily for the next 4 days starting on 05/17/2022 05/16/22  Yes Pilar Jarvis, MD  Spacer/Aero-Holding Chambers (AEROCHAMBER MV) inhaler Use as instructed 05/16/22  Yes Pilar Jarvis, MD  amLODipine (NORVASC) 5 MG tablet Take 10 mg by mouth daily.    [provider]  aspirin EC 81 MG EC tablet Take 1 tablet (81 mg total) by mouth daily. 07/01/15   Hower, Cletis Athens, MD  benzonatate (TESSALON) 100 MG capsule Take 1 capsule (100 mg total) by mouth every 8 (eight) hours as needed for cough. 06/30/15   Hower, Cletis Athens, MD  diclofenac sodium (VOLTAREN) 1 % GEL Apply 2 g topically 4 (four) times daily as needed (for shoulder pain).    [provider]  ezetimibe (ZETIA) 10 MG tablet Take 10 mg by  mouth daily.    [provider]  fluticasone (FLONASE) 50 MCG/ACT nasal spray Place 2 sprays into both nostrils daily as needed for rhinitis.    [provider]  hydrochlorothiazide (HYDRODIURIL) 25 MG tablet Take 25 mg by mouth daily.    [provider]  ipratropium-albuterol (DUONEB) 0.5-2.5 (3) MG/3ML SOLN Take 3 mLs by nebulization every 4 (four) hours. 06/30/15   Hower, Cletis Athens, MD  latanoprost (XALATAN) 0.005 % ophthalmic solution Place 1 drop into both eyes at bedtime.    [provider]  losartan (COZAAR) 50 MG tablet Take 100 mg by mouth daily.    [provider]  omeprazole (PRILOSEC) 20 MG capsule Take 20 mg by mouth daily.    [provider]  pseudoephedrine-dextromethorphan-guaifenesin (ROBITUSSIN-PE) 30-10-100 MG/5ML solution Take 10 mLs by mouth 4 (four) times daily as needed for cough.    [provider]  rosuvastatin (CRESTOR) 20 MG tablet Take 20 mg by mouth at bedtime.    [provider]  SYMBICORT 160-4.5 MCG/ACT inhaler Inhale 2 puffs into the lungs in the morning and at bedtime.    [provider]  tiotropium (SPIRIVA) 18 MCG inhalation capsule Place 18 mcg into inhaler and inhale daily.    [provider]  traZODone (DESYREL) 50 MG tablet Take 50 mg by mouth at bedtime.    [provider]    Physical Exam: Vitals:   05/16/22 0416 05/16/22 0417 05/16/22 0418  BP:  (!) 162/61   Pulse:  (!) 116   Resp: 20    Temp: 98.1 F (36.7 C)    TempSrc: Oral    SpO2:   97%    Constitutional: NAD, calm, comfortable Vitals:   05/16/22 0416 05/16/22 0417 05/16/22 0418  BP:  (!) 162/61   Pulse:  (!) 116   Resp: 20    Temp: 98.1 F (36.7 C)    TempSrc: Oral    SpO2:   97%   Eyes: PERRL, lids and conjunctivae normal ENMT: Mucous membranes are moist. Posterior pharynx clear of any exudate or lesions.Normal dentition.  Neck: normal, supple, no masses, no thyromegaly Respiratory:  faint exp wheezing, no crackles. Normal respiratory effort. No accessory muscle use.  Cardiovascular: Regular rate and rhythm, no murmurs / rubs / gallops. Trace extremity edema. 2+ pedal pulses.  Abdomen: no tenderness, no masses palpated. No hepatosplenomegaly. Bowel sounds positive.  Musculoskeletal: no clubbing / cyanosis. No joint deformity upper and lower extremities. Good ROM, no contractures. Normal muscle tone.  Skin: no rashes, lesions, ulcers. No induration Neurologic: CN 2-12 grossly intact. Sensation intact,  Strength 5/5 in all 4.  Psychiatric: Normal judgment and insight. Alert and  oriented x 3. Normal mood.    Labs on Admission: I have personally reviewed following labs and imaging studies  CBC: Recent Labs  Lab 05/16/22 0422  WBC 18.1*  NEUTROABS 10.9*  HGB 14.7  HCT 46.7*  MCV 88.1  PLT 488*   Basic Metabolic Panel: Recent Labs  Lab 05/16/22 0422  NA 140  K 3.8  CL 105  CO2 26  GLUCOSE 119*  BUN 12  CREATININE 0.66  CALCIUM 9.8   GFR: CrCl cannot be calculated (Unknown ideal weight.). Liver Function Tests: No results for input(s): "AST", "ALT", "ALKPHOS", "BILITOT", "PROT", "ALBUMIN" in the last 168 hours. No results for input(s): "LIPASE", "AMYLASE" in the last 168 hours. No results for input(s): "AMMONIA" in the last 168 hours. Coagulation Profile: No results for input(s): "INR", "PROTIME" in the last 168 hours. Cardiac Enzymes: No results for input(s): "CKTOTAL", "CKMB", "CKMBINDEX", "TROPONINI" in the last 168 hours. BNP (last 3 results) No results for input(s): "PROBNP" in the last 8760 hours. HbA1C: No results for input(s): "HGBA1C" in the last 72 hours. CBG: No results for input(s): "GLUCAP" in the last 168 hours. Lipid Profile: No results for input(s): "CHOL", "HDL", "LDLCALC", "TRIG", "CHOLHDL", "LDLDIRECT" in the last 72 hours. Thyroid Function Tests: No results for input(s): "TSH", "T4TOTAL", "FREET4", "T3FREE", "THYROIDAB" in the  last 72 hours. Anemia Panel: No results for input(s): "VITAMINB12", "FOLATE", "FERRITIN", "TIBC", "IRON", "RETICCTPCT" in the last 72 hours. Urine analysis: No results found for: "COLORURINE", "APPEARANCEUR", "LABSPEC", "PHURINE", "GLUCOSEU", "HGBUR", "BILIRUBINUR", "KETONESUR", "PROTEINUR", "UROBILINOGEN", "NITRITE", "LEUKOCYTESUR"  Radiological Exams on Admission: DG Chest Port 1 View  Result Date: 05/16/2022 CLINICAL DATA:  Dyspnea EXAM: PORTABLE CHEST 1 VIEW COMPARISON:  06/28/2015 FINDINGS: Interstitial prominence without Kerley line. No pleural effusion or pneumothorax. Normal heart size and mediastinal contours. IMPRESSION: Interstitial coarsening likely related to patient's emphysema. No acute superimposed finding. Electronically Signed   By: Tiburcio Pea M.D.   On: 05/16/2022 04:44    EKG: Independently reviewed. See above   Assessment/Plan  Acute exacerbation of COPD/Asthma overload  with associated hypoxic respiratory failure  Acute bacterial bronchitis vs occult PNA Leukocytosis -admit to progressive care  - solumedrol iv , bid taper to prednisone per protocol  - ctx/ azithromycin  -f/u on sputum cultures  -cxr : NAD -CT thorax pending  - nebs standing and prn  -resume chronic inhalers  -pulmonary toilet  -peak flow -wean O2 as able      Hypertension  -not at goal  -continue amlodipine  ,cozaar -prn hydralazine   Gerd -ppi   HLD  -crestor   Tobacco abuse -encourage cessation  -continue tobacco education  -nicotine patch    DVT prophylaxis: heparin Code Status: full/ as discussed per patient wishes in event of cardiac arrest  Family Communication: none at bedside  Disposition Plan: patient  expected to be admitted greater than 2 midnights  Consults called: n/a Admission status: progressive care    Lurline Del MD [facility]   If 7PM-7AM, please contact night-coverage [email or web address] Password [facility]  05/16/2022, 5:46 AM

## 2022-05-16 NOTE — ED Provider Notes (Signed)
[facility] Provider Note    Event Date/Time   First MD Initiated Contact with Patient 05/16/22 [phone]     (approximate)   History   Shortness of Breath (Moist nonproductive cough. Shob on exertion, EMS States 82% on RA on arrival. No oxygen on baseline. Gave 125 mcg Solumedrol and Neb x 1 prior to arrival)   HPI  Deborah Montgomery is a 72 y.o. female   Past medical history of COPD, tobacco use, hyperlipidemia, who presents with shortness of breath and wheezing for 1 day.  Increase in her cough and sputum production.  No fever.  No chest pain.  No leg swelling. She has transient relief with her albuterol inhaler at home. EMS reports that they gave 125 of Solu-Medrol prior to arrival, and that she was hypoxemic to 82% on room air.  She does not use home oxygen.  Independent Historian contributed to assessment above: ems  External Medical Documents Reviewed: D summary from 2017 when she was hospitalized for COPD had nstemi but clean cath - "I suspect supply demand ischemia" and CTA chest neg for PE.       Physical Exam   Triage Vital Signs: ED Triage Vitals  Enc Vitals Group     BP      Pulse      Resp      Temp      Temp src      SpO2      Weight      Height      Head Circumference      Peak Flow      Pain Score      Pain Loc      Pain Edu?      Excl. in GC?     Most recent vital signs: Vitals:   05/16/22 0417 05/16/22 0418  BP: (!) 162/61   Pulse: (!) 116   Resp:    Temp:    SpO2:  97%    General: Awake, no distress.  CV:  Good peripheral perfusion.  Resp:  Normal effort.  Abd:  No distention.  Other:  No respiratory distress.  Diffuse wheezing in all lung fields.  No use of accessory muscles.  Soft nontender abdomen and euvolemic appearing.  No peripheral edema.  Speaking in full sentences, tachycardic after arriving just having received a DuoNeb from EMS   ED Results / Procedures / Treatments   Labs (all labs ordered are  listed, but only abnormal results are displayed) Labs Reviewed  CBC WITH DIFFERENTIAL/PLATELET - Abnormal; Notable for the following components:      Result Value   WBC 18.1 (*)    RBC 5.30 (*)    HCT 46.7 (*)    Platelets 488 (*)    Neutro Abs 10.9 (*)    Lymphs Abs 4.7 (*)    Eosinophils Absolute 1.4 (*)    All other components within normal limits  BASIC METABOLIC PANEL     I ordered and reviewed the above labs they are notable for white blood cell count is elevated at 18  EKG  ED ECG REPORT I, Pilar Jarvis, the attending physician, personally viewed and interpreted this ECG.   Date: 05/16/2022  EKG Time: 0422  Rate: 109  Rhythm: sinus tachycardia  Axis: nl  Intervals:none  ST&T Change: no stemi    RADIOLOGY I independently reviewed and interpreted chest x-ray with some right-sided opacifications concerning for pneumonia   PROCEDURES:  Critical Care performed:  Yes, see critical care procedure note(s)  .Critical Care  Performed by: Pilar Jarvis, MD Authorized by: Pilar Jarvis, MD   Critical care provider statement:    Critical care time (minutes):  30   Critical care was time spent personally by me on the following activities:  Development of treatment plan with patient or surrogate, discussions with consultants, evaluation of patient's response to treatment, examination of patient, ordering and review of laboratory studies, ordering and review of radiographic studies, ordering and performing treatments and interventions, pulse oximetry, re-evaluation of patient's condition and review of old charts    MEDICATIONS ORDERED IN ED: Medications  azithromycin (ZITHROMAX) 500 mg in sodium chloride 0.9 % 250 mL IVPB (has no administration in time range)  cefTRIAXone (ROCEPHIN) 1 g in sodium chloride 0.9 % 100 mL IVPB (has no administration in time range)  sodium chloride 0.9 % bolus 500 mL (has no administration in time range)  ipratropium-albuterol (DUONEB) 0.5-2.5 (3)  MG/3ML nebulizer solution 9 mL (9 mLs Nebulization Given 05/16/22 0432)  predniSONE (DELTASONE) tablet 60 mg (60 mg Oral Given 05/16/22 0432)    External physician / consultants:  I spoke with hospitalist for admission and regarding care plan for this patient.   IMPRESSION / MDM / ASSESSMENT AND PLAN / ED COURSE  I reviewed the triage vital signs and the nursing notes.                                Patient's presentation is most consistent with acute presentation with potential threat to life or bodily function.  Differential diagnosis includes, but is not limited to, COPD exacerbation, acute hypoxemic respiratory failure, bacterial pneumonia, viral URI, ACS or PE   The patient is on the cardiac monitor to evaluate for evidence of arrhythmia and/or significant heart rate changes.  MDM: This patient with COPD exacerbation with increased sputum production, cough, wheezing throughout with transient relief at home with albuterol.  Hypoxemic now 82% on room air but no respiratory distress.  Put on nasal cannula O2 and getting more DuoNebs and prednisone treatment as well as basic labs and a chest x-ray.  I do not think she is having ACS or PE given no chest pain and clinical exam and history is very consistent with presentation of COPD exacerbation. ----  The patient has an increased white blood cell count and COPD exacerbation, new oxygen requirement, and tachycardia, I will admit her for moderate/severe COPD exacerbation and have written for her to receive DuoNebs, prednisone, IV fluid bolus, and community-acquired pneumonia coverage via IV.  Technically reached sepsis criteria with tachycardia (albeit may be induced by albuterol nebulizers) white blood cell count and suspected respiratory infectious source.   --- I spent 5 minutes counseling this patient on smoking cessation.  We spoke about the patient's current tobacco use, impact of smoking, assessed willingness to quit, methods for  cessation including medical management and nicotine replacement therapy (which I prescribed to the patient) and advised follow-up with primary doctor to continue to address smoking cessation. ---        FINAL CLINICAL IMPRESSION(S) / ED DIAGNOSES   Final diagnoses:  COPD exacerbation  Encounter for smoking cessation counseling  Hypoxia     Rx / DC Orders   ED Discharge Orders          Ordered    nicotine (NICODERM CQ - DOSED IN MG/24 HOURS) 14 mg/24hr patch  Daily  05/16/22 0423    nicotine polacrilex (NICOTINE MINI) 4 MG lozenge  As needed        05/16/22 0423    predniSONE (DELTASONE) 50 MG tablet        05/16/22 0423    albuterol (VENTOLIN HFA) 108 (90 Base) MCG/ACT inhaler  Every 6 hours PRN        05/16/22 0423    Spacer/Aero-Holding Chambers (AEROCHAMBER MV) inhaler        05/16/22 0423             Note:  This document was prepared using Dragon voice recognition software and may include unintentional dictation errors.    Pilar Jarvis, MD 05/16/22 [phone]

## 2022-05-16 NOTE — Progress Notes (Signed)
  INTERVAL PROGRESS NOTE    Deborah Montgomery- 72 y.o. female  LOS: 0 __________________________________________________________________  SUBJECTIVE: Admitted 05/16/2022 with cc of  Chief Complaint  Patient presents with   Shortness of Breath    Moist nonproductive cough. Shob on exertion, EMS States 82% on RA on arrival. No oxygen on baseline. Gave 125 mcg Solumedrol and Neb x 1 prior to arrival   Since admission, remains stable.   OBJECTIVE: Blood pressure (!) 159/81, pulse (!) 107, temperature 98.1 F (36.7 C), temperature source Oral, resp. rate (!) 24, SpO2 93 %.  ASSESSMENT/PLAN:  I have reviewed the full H&P by Dr. Maisie Fus, and I agree with the assessment and plan as outlined therein.   In addition: 3.4cm lesion in thyroid will need follow up ultrasound outpatient if not already done.    Principal Problem:   Acute exacerbation of chronic obstructive pulmonary disease (COPD)   Leeroy Bock, DO [facility] 05/16/2022, 8:03 AM    [email or web address] Available by Epic secure chat 7AM-7PM. If 7PM-7AM, please contact night-coverage   No Charge

## 2022-05-16 NOTE — Discharge Instructions (Addendum)
Please follow up with your primary doctor to monitor your recovery and review your medications.

## 2022-05-17 DIAGNOSIS — R0902 Hypoxemia: Secondary | ICD-10-CM

## 2022-05-17 DIAGNOSIS — Z716 Tobacco abuse counseling: Secondary | ICD-10-CM

## 2022-05-17 DIAGNOSIS — J441 Chronic obstructive pulmonary disease with (acute) exacerbation: Principal | ICD-10-CM

## 2022-05-17 LAB — COMPREHENSIVE METABOLIC PANEL
ALT: 14 U/L (ref 0–44)
AST: 24 U/L (ref 15–41)
Albumin: 3.5 g/dL (ref 3.5–5.0)
Alkaline Phosphatase: 85 U/L (ref 38–126)
Anion gap: 8 (ref 5–15)
BUN: 14 mg/dL (ref 8–23)
CO2: 24 mmol/L (ref 22–32)
Calcium: 9.2 mg/dL (ref 8.9–10.3)
Chloride: 106 mmol/L (ref 98–111)
Creatinine, Ser: 0.65 mg/dL (ref 0.44–1.00)
GFR, Estimated: >60 mL/min (ref >60)
Glucose, Bld: 139 mg/dL — ABNORMAL HIGH (ref 70–99)
Potassium: 3.9 mmol/L (ref 3.5–5.1)
Sodium: 138 mmol/L (ref 135–145)
Total Bilirubin: 0.5 mg/dL (ref 0.3–1.2)
Total Protein: 6.8 g/dL (ref 6.5–8.1)

## 2022-05-17 LAB — CBC
HCT: 41.9 % (ref 36.0–46.0)
Hemoglobin: 13.6 g/dL (ref 12.0–15.0)
MCH: 27.6 pg (ref 26.0–34.0)
MCHC: 32.5 g/dL (ref 30.0–36.0)
MCV: 85.2 fL (ref 80.0–100.0)
Platelets: 467 10*3/uL — ABNORMAL HIGH (ref 150–400)
RBC: 4.92 MIL/uL (ref 3.87–5.11)
RDW: 13 % (ref 11.5–15.5)
WBC: 15 10*3/uL — ABNORMAL HIGH (ref 4.0–10.5)
nRBC: 0 % (ref 0.0–0.2)

## 2022-05-17 MED ORDER — ALBUTEROL SULFATE HFA 108 (90 BASE) MCG/ACT IN AERS: 1.0000 | INHALATION_SPRAY | RESPIRATORY_TRACT | Status: DC | PRN

## 2022-05-17 MED ORDER — AZITHROMYCIN 250 MG PO TABS: 500.0000 mg | ORAL_TABLET | Freq: Every day | ORAL | Status: DC

## 2022-05-17 MED ORDER — SODIUM CHLORIDE 0.9 % IV SOLN: INTRAVENOUS | Status: DC | PRN

## 2022-05-17 MED ORDER — AZITHROMYCIN 250 MG PO TABS: 250.0000 mg | ORAL_TABLET | Freq: Every day | ORAL | 0 refills | Status: AC

## 2022-05-17 NOTE — Care Management Obs Status (Signed)
MEDICARE OBSERVATION STATUS NOTIFICATION   Patient Details  Name: Deborah Montgomery MRN: [id number] Date of Birth: [birth year]   Medicare Observation Status Notification Given:  Yes    Truddie Hidden, RN 05/17/2022, 11:10 AM

## 2022-05-17 NOTE — Discharge Summary (Signed)
Physician Discharge Summary  Patient: Deborah Montgomery ZOX:[id number] DOB: 13-Jul-1950   Code Status: Full Code Admit date: 05/16/2022 Discharge date: 05/17/2022 Disposition: Home, No home health services recommended PCP: Mercy Moore, MD  Recommendations for Outpatient Follow-up:  Follow up with PCP within 1-2 weeks Regarding general hospital follow up and preventative care Recommend reviewing COPD meds. Consider pulmonology referral if having frequent exacerbations  Discharge Diagnoses:  Principal Problem:   Acute exacerbation of chronic obstructive pulmonary disease (COPD) (HCC) Active Problems:   COPD with acute exacerbation (HCC)   Encounter for smoking cessation counseling   Hypoxia   COPD exacerbation [facility])  Brief Hospital Course Summary: Deborah Montgomery is a 72 y.o. female with medical history significant of anxiety, asthma/copd, hypertension , gerd, HLD , tobacco abuse  who presents with  1 week for progressive sob worse over the last 1 day with significant progression hours pta. Patient notes symptoms with associated congestion but unable to have productive cough. Patient notes no fever no chest pain ,  diarrhea but does not recent nausea.  Patient notes use of increase use of home inhaler without improvement. In the field patient  was noted to be hypxoic to 82% on RA , no current hx of home O2.  Patient placed on O2 given solumedrol 125mg  iv x 1 and transported to ED   ED Course:  Vitals: afeb, bp 162/61, hr 116, sat 97% on 3L  rr 20  Wbc 18.1, hgb 14.7,plt 488, increase pmn  Na 140, K 3.8, CL 105, glu 119 , cr 0.66  EKG: sinus tachycardia    Tx  pred 60 mg po , douneb, ctx , azithromycin , zofran  ns  Discharge Condition: Good, improved Recommended discharge diet: Regular healthy diet  Consultations: None   Procedures/Studies: None   Allergies as of 05/17/2022   No Known Allergies      Medication List     STOP taking these medications     benzonatate 100 MG capsule Commonly known as: TESSALON   ezetimibe 10 MG tablet Commonly known as: ZETIA   hydrochlorothiazide 25 MG tablet Commonly known as: HYDRODIURIL   predniSONE 10 MG (21) Tbpk tablet Commonly known as: STERAPRED UNI-PAK 21 TAB Replaced by: predniSONE 50 MG tablet       TAKE these medications    AeroChamber MV inhaler Use as instructed   albuterol 108 (90 Base) MCG/ACT inhaler Commonly known as: VENTOLIN HFA Inhale 2 puffs into the lungs every 6 (six) hours as needed for wheezing or shortness of breath. What changed: when to take this   alendronate 70 MG tablet Commonly known as: FOSAMAX Take 70 mg by mouth once a week.   amLODipine 5 MG tablet Commonly known as: NORVASC Take 10 mg by mouth daily.   aspirin EC 81 MG tablet Take 1 tablet (81 mg total) by mouth daily.   azithromycin 250 MG tablet Commonly known as: ZITHROMAX Take 1 tablet (250 mg total) by mouth daily for 4 days. MAY ADMINISTER WITHOUT REGARD TO MEALS   diclofenac sodium 1 % Gel Commonly known as: VOLTAREN Apply 2 g topically 4 (four) times daily as needed (for shoulder pain).   fluticasone 50 MCG/ACT nasal spray Commonly known as: FLONASE Place 2 sprays into both nostrils daily as needed for rhinitis.   guaiFENesin 600 MG 12 hr tablet Commonly known as: MUCINEX Take 600 mg by mouth 2 (two) times daily.   ipratropium-albuterol 0.5-2.5 (3) MG/3ML Soln Commonly known  as: DUONEB Take 3 mLs by nebulization every 4 (four) hours.   latanoprost 0.005 % ophthalmic solution Commonly known as: XALATAN Place 1 drop into both eyes at bedtime.   losartan 50 MG tablet Commonly known as: COZAAR Take 100 mg by mouth daily.   montelukast 10 MG tablet Commonly known as: SINGULAIR Take 10 mg by mouth daily.   nicotine 14 mg/24hr patch Commonly known as: NICODERM CQ - dosed in mg/24 hours Place 1 patch (14 mg total) onto the skin daily. What changed: how long to infuse  this   nicotine polacrilex 4 MG lozenge Commonly known as: Nicotine Mini Take 1 lozenge (4 mg total) by mouth as needed for smoking cessation.   omeprazole 20 MG capsule Commonly known as: PRILOSEC Take 20 mg by mouth daily.   predniSONE 50 MG tablet Commonly known as: DELTASONE Take 1 tablet daily for the next 4 days starting on 05/17/2022 Replaces: predniSONE 10 MG (21) Tbpk tablet   pseudoephedrine-dextromethorphan-guaifenesin 30-10-100 MG/5ML solution Commonly known as: ROBITUSSIN-PE Take 10 mLs by mouth 4 (four) times daily as needed for cough.   rosuvastatin 20 MG tablet Commonly known as: CRESTOR Take 20 mg by mouth at bedtime.   Symbicort 160-4.5 MCG/ACT inhaler Generic drug: budesonide-formoterol Inhale 2 puffs into the lungs in the morning and at bedtime.   tiotropium 18 MCG inhalation capsule Commonly known as: SPIRIVA Place 18 mcg into inhaler and inhale daily.   traZODone 50 MG tablet Commonly known as: DESYREL Take 50 mg by mouth at bedtime.               Durable Medical Equipment  (From admission, onward)           Start     Ordered   05/17/22 0930  For home use only DME Nebulizer machine  Once       Question Answer Comment  Patient needs a nebulizer to treat with the following condition COPD (chronic obstructive pulmonary disease) (HCC)   Length of Need Lifetime      05/17/22 0930   05/17/22 0930  For home use only DME Nebulizer/meds  Once       Question Answer Comment  Patient needs a nebulizer to treat with the following condition COPD (chronic obstructive pulmonary disease) (HCC)   Length of Need 6 Months      05/17/22 0930             Subjective   Pt reports ***  All questions and concerns were addressed at time of discharge.  Objective  Blood pressure (!) 113/55, pulse (!) 103, temperature 98 F (36.7 C), resp. rate 18, SpO2 95 %.   General: Pt is alert, awake, not in acute distress Cardiovascular: RRR, S1/S2 +, no  rubs, no gallops Respiratory: CTA bilaterally, no wheezing, no rhonchi Abdominal: Soft, NT, ND, bowel sounds + Extremities: no edema, no cyanosis  The results of significant diagnostics from this hospitalization (including imaging, microbiology, ancillary and laboratory) are listed below for reference.   Imaging studies: CT CHEST W CONTRAST  Result Date: 05/16/2022 CLINICAL DATA:  Shortness of breath. Nonproductive cough. Acute asthma. EXAM: CT CHEST WITH CONTRAST TECHNIQUE: Multidetector CT imaging of the chest was performed during intravenous contrast administration. RADIATION DOSE REDUCTION: This exam was performed according to the departmental dose-optimization program which includes automated exposure control, adjustment of the mA and/or kV according to patient size and/or use of iterative reconstruction technique. CONTRAST:  75mL OMNIPAQUE IOHEXOL 300 MG/ML  SOLN COMPARISON:  Chest CTA 06/29/2015 FINDINGS: Cardiovascular: The heart size is normal. No substantial pericardial effusion. Mild atherosclerotic calcification is noted in the wall of the thoracic aorta. Mediastinum/Nodes: 3.4 cm left thyroid mass evident. No mediastinal lymphadenopathy. There is no hilar lymphadenopathy. The esophagus has normal imaging features. There is no axillary lymphadenopathy. Lungs/Pleura: Centrilobular emphsyema noted. 7 mm posterior right lower lobe pulmonary nodule on 101/3 is stable in the interval. Tiny peripheral left lower lobe nodules are unchanged. No new suspicious pulmonary nodule or mass. Mild bronchial wall thickening noted in the lower lungs bilaterally with chronic atelectasis or scarring in the right middle lobe and lingula. Peripheral small airway impaction noted left lower lobe. No pleural effusion. Upper Abdomen: Stable tiny hepatic cyst. No followup imaging is recommended. Stable adrenal thickening without a discrete nodule. 3 mm nonobstructing stone noted interpolar left kidney. Musculoskeletal:  No worrisome lytic or sclerotic osseous abnormality. Asymmetric soft tissue lesion in the left breast is stable in the interval. IMPRESSION: 1. Peripheral small airway impaction noted left lower lobe. Imaging features may reflect sequelae of chronic atypical infection or inflammation. 2. Otherwise, no acute findings in the chest. 3. Mild bronchial wall thickening in the lower lungs bilaterally with chronic atelectasis or scarring in the right middle lobe and lingula. 4. Bilateral pulmonary nodules are stable consistent with benign etiology. No followup imaging is recommended. 5. 3.4 cm left thyroid mass. Recommend thyroid US (ref: J Am Coll Radiol. 2015 Feb;12(2): 143-50). 6. Nonobstructing 3 mm left renal stone. 7.  Aortic Atherosclerosis (ICD10-I70.0). Electronically Signed   By: Kennith Center M.D.   On: 05/16/2022 07:46   DG Chest Port 1 View  Result Date: 05/16/2022 CLINICAL DATA:  Dyspnea EXAM: PORTABLE CHEST 1 VIEW COMPARISON:  06/28/2015 FINDINGS: Interstitial prominence without Kerley line. No pleural effusion or pneumothorax. Normal heart size and mediastinal contours. IMPRESSION: Interstitial coarsening likely related to patient's emphysema. No acute superimposed finding. Electronically Signed   By: Tiburcio Pea M.D.   On: 05/16/2022 04:44    Labs: Basic Metabolic Panel: Recent Labs  Lab 05/16/22 0422 05/16/22 0709 05/17/22 0355  NA 140  --  138  K 3.8  --  3.9  CL 105  --  106  CO2 26  --  24  GLUCOSE 119*  --  139*  BUN 12  --  14  CREATININE 0.66 0.67 0.65  CALCIUM 9.8  --  9.2   CBC: Recent Labs  Lab 05/16/22 0422 05/16/22 0709 05/17/22 0355  WBC 18.1* 18.2* 15.0*  NEUTROABS 10.9*  --   --   HGB 14.7 13.1 13.6  HCT 46.7* 41.8 41.9  MCV 88.1 87.3 85.2  PLT 488* 444* 467*   Microbiology: ***  Time coordinating discharge: Over 30 minutes  Leeroy Bock, MD  [facility] 05/17/2022, 10:39 AM

## 2022-05-17 NOTE — Progress Notes (Signed)
  Patient Saturations on Room Air at Rest = 94%  Patient Saturations on Room Air while Ambulating = 91%   

## 2022-05-17 NOTE — Care Management CC44 (Signed)
Condition Code 44 Documentation Completed  Patient Details  Name: Deborah Montgomery MRN: [id number] Date of Birth: 1950-11-09   Condition Code 44 given:  Yes Patient signature on Condition Code 44 notice:  Yes Documentation of 2 MD's agreement:  Yes Code 44 added to claim:  Yes    Truddie Hidden, RN 05/17/2022, 11:10 AM

## 2022-11-25 ENCOUNTER — Emergency Department: Payer: 59

## 2022-11-25 ENCOUNTER — Other Ambulatory Visit: Payer: Self-pay

## 2022-11-25 ENCOUNTER — Inpatient Hospital Stay
Admission: EM | Admit: 2022-11-25 | Discharge: 2022-11-27 | DRG: 192 | Disposition: A | Discharge status: Home / Self Care | Payer: 59 | Attending: Family Medicine | Admitting: Family Medicine

## 2022-11-25 ENCOUNTER — Encounter: Payer: Self-pay | Admitting: Internal Medicine

## 2022-11-25 DIAGNOSIS — Z1152 Encounter for screening for COVID-19: Secondary | ICD-10-CM

## 2022-11-25 DIAGNOSIS — D72829 Elevated white blood cell count, unspecified: Secondary | ICD-10-CM

## 2022-11-25 DIAGNOSIS — Z7982 Long term (current) use of aspirin: Secondary | ICD-10-CM

## 2022-11-25 DIAGNOSIS — F17210 Nicotine dependence, cigarettes, uncomplicated: Secondary | ICD-10-CM | POA: Diagnosis present

## 2022-11-25 DIAGNOSIS — Z79899 Other long term (current) drug therapy: Secondary | ICD-10-CM

## 2022-11-25 DIAGNOSIS — R000 Tachycardia, unspecified: Secondary | ICD-10-CM | POA: Diagnosis not present

## 2022-11-25 DIAGNOSIS — J441 Chronic obstructive pulmonary disease with (acute) exacerbation: Secondary | ICD-10-CM | POA: Diagnosis not present

## 2022-11-25 DIAGNOSIS — I10 Essential (primary) hypertension: Secondary | ICD-10-CM | POA: Diagnosis present

## 2022-11-25 DIAGNOSIS — Z7983 Long term (current) use of bisphosphonates: Secondary | ICD-10-CM

## 2022-11-25 DIAGNOSIS — E785 Hyperlipidemia, unspecified: Secondary | ICD-10-CM | POA: Diagnosis present

## 2022-11-25 DIAGNOSIS — Z6831 Body mass index (BMI) 31.0-31.9, adult: Secondary | ICD-10-CM

## 2022-11-25 DIAGNOSIS — Z803 Family history of malignant neoplasm of breast: Secondary | ICD-10-CM

## 2022-11-25 DIAGNOSIS — K219 Gastro-esophageal reflux disease without esophagitis: Secondary | ICD-10-CM | POA: Diagnosis present

## 2022-11-25 DIAGNOSIS — E669 Obesity, unspecified: Secondary | ICD-10-CM | POA: Diagnosis present

## 2022-11-25 DIAGNOSIS — Z7951 Long term (current) use of inhaled steroids: Secondary | ICD-10-CM

## 2022-11-25 LAB — CBC WITH DIFFERENTIAL/PLATELET
Abs Immature Granulocytes: 0.06 10*3/uL (ref 0.00–0.07)
Basophils Absolute: 0.1 10*3/uL (ref 0.0–0.1)
Basophils Relative: 1 %
Eosinophils Absolute: 0.3 10*3/uL (ref 0.0–0.5)
Eosinophils Relative: 2 %
HCT: 46.4 % — ABNORMAL HIGH (ref 36.0–46.0)
Hemoglobin: 14.8 g/dL (ref 12.0–15.0)
Immature Granulocytes: 0 %
Lymphocytes Relative: 8 %
Lymphs Abs: 1.3 10*3/uL (ref 0.7–4.0)
MCH: 28.2 pg (ref 26.0–34.0)
MCHC: 31.9 g/dL (ref 30.0–36.0)
MCV: 88.5 fL (ref 80.0–100.0)
Monocytes Absolute: 0.2 10*3/uL (ref 0.1–1.0)
Monocytes Relative: 2 %
Neutro Abs: 14.3 10*3/uL — ABNORMAL HIGH (ref 1.7–7.7)
Neutrophils Relative %: 87 %
Platelets: 442 10*3/uL — ABNORMAL HIGH (ref 150–400)
RBC: 5.24 MIL/uL — ABNORMAL HIGH (ref 3.87–5.11)
RDW: 12.8 % (ref 11.5–15.5)
WBC: 16.3 10*3/uL — ABNORMAL HIGH (ref 4.0–10.5)
nRBC: 0 % (ref 0.0–0.2)

## 2022-11-25 LAB — BASIC METABOLIC PANEL
Anion gap: 10 (ref 5–15)
BUN: 11 mg/dL (ref 8–23)
CO2: 24 mmol/L (ref 22–32)
Calcium: 9.1 mg/dL (ref 8.9–10.3)
Chloride: 103 mmol/L (ref 98–111)
Creatinine, Ser: 0.58 mg/dL (ref 0.44–1.00)
GFR, Estimated: >60 mL/min (ref >60)
Glucose, Bld: 112 mg/dL — ABNORMAL HIGH (ref 70–99)
Potassium: 3.8 mmol/L (ref 3.5–5.1)
Sodium: 137 mmol/L (ref 135–145)

## 2022-11-25 LAB — RESP PANEL BY RT-PCR (RSV, FLU A&B, COVID)  RVPGX2
Influenza A by PCR: NEGATIVE
Influenza B by PCR: NEGATIVE
Resp Syncytial Virus by PCR: NEGATIVE
SARS Coronavirus 2 by RT PCR: NEGATIVE

## 2022-11-25 LAB — CBC
HCT: 47.4 % — ABNORMAL HIGH (ref 36.0–46.0)
Hemoglobin: 14.9 g/dL (ref 12.0–15.0)
MCH: 27.8 pg (ref 26.0–34.0)
MCHC: 31.4 g/dL (ref 30.0–36.0)
MCV: 88.4 fL (ref 80.0–100.0)
Platelets: 471 10*3/uL — ABNORMAL HIGH (ref 150–400)
RBC: 5.36 MIL/uL — ABNORMAL HIGH (ref 3.87–5.11)
RDW: 12.8 % (ref 11.5–15.5)
WBC: 13.9 10*3/uL — ABNORMAL HIGH (ref 4.0–10.5)
nRBC: 0 % (ref 0.0–0.2)

## 2022-11-25 LAB — CREATININE, SERUM
Creatinine, Ser: 0.55 mg/dL (ref 0.44–1.00)
GFR, Estimated: >60 mL/min (ref >60)

## 2022-11-25 LAB — PROCALCITONIN: Procalcitonin: <0.1 ng/mL

## 2022-11-25 MED ORDER — LATANOPROST 0.005 % OP SOLN
1.0000 [drp] | Freq: Every day | OPHTHALMIC | Status: DC
Start: 2022-11-25 — End: 2022-11-27
  Administered 2022-11-25 – 2022-11-26 (×2): 1 [drp] via OPHTHALMIC
  Filled 2022-11-25: qty 2.5

## 2022-11-25 MED ORDER — IPRATROPIUM-ALBUTEROL 0.5-2.5 (3) MG/3ML IN SOLN
3.0000 mL | Freq: Once | RESPIRATORY_TRACT | Status: AC
  Administered 2022-11-25: 3 mL via RESPIRATORY_TRACT
  Filled 2022-11-25: qty 3

## 2022-11-25 MED ORDER — IPRATROPIUM-ALBUTEROL 0.5-2.5 (3) MG/3ML IN SOLN
3.0000 mL | Freq: Four times a day (QID) | RESPIRATORY_TRACT | Status: DC
  Administered 2022-11-25 – 2022-11-26 (×6): 3 mL via RESPIRATORY_TRACT
  Filled 2022-11-25 (×6): qty 3

## 2022-11-25 MED ORDER — AZITHROMYCIN 500 MG PO TABS: 500.0000 mg | ORAL_TABLET | Freq: Every day | ORAL | Status: DC

## 2022-11-25 MED ORDER — MOMETASONE FURO-FORMOTEROL FUM 200-5 MCG/ACT IN AERO
2.0000 | INHALATION_SPRAY | Freq: Two times a day (BID) | RESPIRATORY_TRACT | Status: DC
  Administered 2022-11-25 – 2022-11-27 (×4): 2 via RESPIRATORY_TRACT
  Filled 2022-11-25: qty 8.8

## 2022-11-25 MED ORDER — AMLODIPINE BESYLATE 5 MG PO TABS: 10.0000 mg | ORAL_TABLET | Freq: Every day | ORAL | Status: DC

## 2022-11-25 MED ORDER — ENOXAPARIN SODIUM 40 MG/0.4ML IJ SOSY
40.0000 mg | PREFILLED_SYRINGE | INTRAMUSCULAR | Status: DC
  Administered 2022-11-25 – 2022-11-26 (×2): 40 mg via SUBCUTANEOUS
  Filled 2022-11-25 (×2): qty 0.4

## 2022-11-25 MED ORDER — MONTELUKAST SODIUM 10 MG PO TABS
10.0000 mg | ORAL_TABLET | Freq: Every day | ORAL | Status: DC
  Administered 2022-11-25 – 2022-11-26 (×2): 10 mg via ORAL
  Filled 2022-11-25 (×2): qty 1

## 2022-11-25 MED ORDER — DEXTROSE 5 % IV SOLN: 500.0000 mg | INTRAVENOUS | Status: DC

## 2022-11-25 MED ORDER — AZITHROMYCIN 500 MG PO TABS
500.0000 mg | ORAL_TABLET | Freq: Every day | ORAL | Status: DC
Start: 2022-11-26 — End: 2022-11-25

## 2022-11-25 MED ORDER — ROSUVASTATIN CALCIUM 10 MG PO TABS
20.0000 mg | ORAL_TABLET | Freq: Every day | ORAL | Status: DC
  Administered 2022-11-25 – 2022-11-26 (×2): 20 mg via ORAL
  Filled 2022-11-25: qty 2
  Filled 2022-11-25: qty 1

## 2022-11-25 MED ORDER — PANTOPRAZOLE SODIUM 40 MG PO TBEC
40.0000 mg | DELAYED_RELEASE_TABLET | Freq: Every day | ORAL | Status: DC
  Administered 2022-11-26 – 2022-11-27 (×2): 40 mg via ORAL
  Filled 2022-11-25 (×2): qty 1

## 2022-11-25 MED ORDER — EZETIMIBE 10 MG PO TABS
10.0000 mg | ORAL_TABLET | Freq: Every day | ORAL | Status: DC
  Administered 2022-11-26 – 2022-11-27 (×2): 10 mg via ORAL
  Filled 2022-11-25 (×2): qty 1

## 2022-11-25 MED ORDER — LOSARTAN POTASSIUM 50 MG PO TABS
100.0000 mg | ORAL_TABLET | Freq: Every day | ORAL | Status: DC
  Administered 2022-11-26 – 2022-11-27 (×2): 100 mg via ORAL
  Filled 2022-11-25 (×2): qty 2

## 2022-11-25 MED ORDER — PREDNISONE 20 MG PO TABS: 40.0000 mg | ORAL_TABLET | Freq: Every day | ORAL | Status: DC

## 2022-11-25 MED ORDER — SODIUM CHLORIDE 0.9 % IV SOLN
500.0000 mg | Freq: Once | INTRAVENOUS | Status: AC
  Administered 2022-11-25: 500 mg via INTRAVENOUS
  Filled 2022-11-25: qty 5

## 2022-11-25 MED ORDER — SODIUM CHLORIDE 0.9% FLUSH
3.0000 mL | Freq: Two times a day (BID) | INTRAVENOUS | Status: DC
  Administered 2022-11-25 – 2022-11-27 (×5): 3 mL via INTRAVENOUS

## 2022-11-25 NOTE — ED Provider Notes (Signed)
[facility] Provider Note    Event Date/Time   First MD Initiated Contact with Patient 11/25/22 0831     (approximate)   History   Shortness of Breath   HPI  Deborah Montgomery is a 72 y.o. female  who presents to the emergency department today because of concern for shortness of breath. Patient has history of COPD. States she ran out of her medication last week. Did pick up a generic albuterol inhaler but feels it is not as effective. The patient did have EMS come to her house yesterday for shortness of breath but felt better after a breathing treatment so did not request transport. Today when she woke up felt short of breath again. Has had associated cough. Denies chest pain or fevers. This does remind her of when she has had COPD flare ups in the past.        Physical Exam   Triage Vital Signs: ED Triage Vitals  Encounter Vitals Group     BP 11/25/22 0848 (!) 149/85     Systolic BP Percentile --      Diastolic BP Percentile --      Pulse Rate 11/25/22 0845 (!) 105     Resp 11/25/22 0845 20     Temp 11/25/22 0845 98.6 F (37 C)     Temp Source 11/25/22 0845 Oral     SpO2 11/25/22 0845 100 %     Weight --      Height --      Head Circumference --      Peak Flow --      Pain Score 11/25/22 0846 0     Pain Loc --      Pain Education --      Exclude from Growth Chart --     Most recent vital signs: Vitals:   11/25/22 0845 11/25/22 0848  BP:  (!) 149/85  Pulse: (!) 105   Resp: 20   Temp: 98.6 F (37 C)   SpO2: 100%    General: Awake, alert, oriented. CV:  Good peripheral perfusion. Tachycardia. Resp:  Increased work of breathing. Diffuse expiratory wheezing. Abd:  No distention.    ED Results / Procedures / Treatments   Labs (all labs ordered are listed, but only abnormal results are displayed) Labs Reviewed  CBC WITH DIFFERENTIAL/PLATELET - Abnormal; Notable for the following components:      Result Value   WBC 16.3 (*)    RBC  5.24 (*)    HCT 46.4 (*)    Platelets 442 (*)    Neutro Abs 14.3 (*)    All other components within normal limits  BASIC METABOLIC PANEL - Abnormal; Notable for the following components:   Glucose, Bld 112 (*)    All other components within normal limits  RESP PANEL BY RT-PCR (RSV, FLU A&B, COVID)  [id number]  RESPIRATORY PANEL BY PCR  PROCALCITONIN  CBC  CREATININE, SERUM     EKG  I, Phineas Semen, attending physician, personally viewed and interpreted this EKG  EKG Time: 0951 Rate: 105 Rhythm: sinus tachycardia Axis: normal Intervals: qtc 437 QRS: narrow ST changes: no st elevation Impression: abnormal ekg  RADIOLOGY I independently interpreted and visualized the CXR. My interpretation: No pneumonia Radiology interpretation:  IMPRESSION:  No active cardiopulmonary disease.      PROCEDURES:  Critical Care performed: No   MEDICATIONS ORDERED IN ED: Medications - No data to display   IMPRESSION / MDM / ASSESSMENT AND  PLAN / ED COURSE  I reviewed the triage vital signs and the nursing notes.                              Differential diagnosis includes, but is not limited to, covid, pneumonia, COPD  Patient's presentation is most consistent with acute presentation with potential threat to life or bodily function.   The patient is on the cardiac monitor to evaluate for evidence of arrhythmia and/or significant heart rate changes.  Patient presented to the emergency department today because of concerns for shortness of breath.  On exam patient does have diffuse expiratory wheezing.  Patient was given Solu-Medrol and breathing treatments by EMS.  Workup here without findings concerning for pneumonia.  Chest x-ray without pneumonia, no leukocytosis or fevers.  Patient was given further breathing treatments here however continued to have shortness of breath especially with exertion.  Discussed with Dr. Huel Cote with the hospitalist service who will evaluate for  admission.      FINAL CLINICAL IMPRESSION(S) / ED DIAGNOSES   Final diagnoses:  COPD exacerbation (HCC)     Note:  This document was prepared using Dragon voice recognition software and may include unintentional dictation errors.    Phineas Semen, MD 11/25/22 1450

## 2022-11-25 NOTE — H&P (Signed)
History and Physical    Patient: Deborah Montgomery:[id number] DOB: [birth year] DOA: 11/25/2022 DOS: the patient was seen and examined on 11/25/2022 PCP: Alm Bustard, NP  Patient coming from: Home  Chief Complaint:  Chief Complaint  Patient presents with   Shortness of Breath   HPI: Deborah Montgomery is a 72 y.o. female with medical history significant of COPD, hypertension, hyperlipidemia, GERD, who presents to the ED due to shortness of breath.  Deborah Montgomery states that she ran out of her home Symbicort about 1 week ago and has been using a expired prescription that is for lower dose.  Over the last couple days, she has developed worsening shortness of breath and increased cough with green sputum production.  Yesterday, EMS was called to her home due to shortness of breath, however she felt much better after a breathing treatment so she did not come to the ED.  Today, her breathing continued to worsen and she requested to be brought to the ED.  At this time, her symptoms have improved but still remain.  She denies any nausea, vomiting, diarrhea, abdominal pain, rhinorrhea, congestion, chest pain, palpitations or lower extremity swelling.  ED course: On arrival to the ED, patient was hypertensive at 149/85 with heart rate 107.  She was saturating at 100% with respiratory rate of 20.  She was afebrile at 98.6.  Initial workup demonstrated WBC with 16.3, platelets of 442, glucose of 112 but otherwise normal CBC and BMP.  COVID-19, influenza and RSV PCR negative.  Chest x-ray with no acute opacities.  Patient started on Solu-Medrol and [facility] contacted for admission.   Review of Systems: As mentioned in the history of present illness. All other systems reviewed and are negative.  Past Medical History:  Diagnosis Date   Anxiety    Arthritis    Asthma    COPD (chronic obstructive pulmonary disease) (HCC)    a. diagnosed 05/2015   Essential hypertension    Eye problem    GERD (gastroesophageal  reflux disease)    HLD (hyperlipidemia)    a. intolerant to simvastatin    Tobacco abuse    Past Surgical History:  Procedure Laterality Date   ABDOMINAL HYSTERECTOMY     CARDIAC CATHETERIZATION N/A 06/30/2015   Procedure: Left Heart Cath and Coronary Angiography;  Surgeon: Iran Ouch, MD;  Location: [facility] INVASIVE CV LAB;  Service: Cardiovascular;  Laterality: N/A;   CARPAL TUNNEL RELEASE     Social History:  reports that she has been smoking cigarettes. She does not have any smokeless tobacco history on file. She reports that she does not drink alcohol and does not use drugs.  No Known Allergies  Family History  Problem Relation Age of Onset   Breast cancer Paternal Aunt    Breast cancer Paternal Aunt     Prior to Admission medications   Medication Sig Start Date End Date Taking? Authorizing Provider  ibuprofen (ADVIL) 800 MG tablet Take 800 mg by mouth every 6 (six) hours as needed. 10/03/22  Yes [provider]  Vitamin D, Ergocalciferol, (DRISDOL) 1.25 MG (50000 UNIT) CAPS capsule Take 50,000 Units by mouth once a week. 10/03/22  Yes [provider]  albuterol (VENTOLIN HFA) 108 (90 Base) MCG/ACT inhaler Inhale 2 puffs into the lungs every 6 (six) hours as needed for wheezing or shortness of breath. 05/16/22   Pilar Jarvis, MD  alendronate (FOSAMAX) 70 MG tablet Take 70 mg by mouth once a week. 04/22/22 04/22/23  [provider]  amLODipine (NORVASC) 5 MG tablet Take 10 mg by mouth daily.    [provider]  aspirin EC 81 MG EC tablet Take 1 tablet (81 mg total) by mouth daily. Patient not taking: Reported on 05/16/2022 07/01/15   Hower, Cletis [place], MD  diclofenac sodium (VOLTAREN) 1 % GEL Apply 2 g topically 4 (four) times daily as needed (for shoulder pain).    [provider]  ezetimibe (ZETIA) 10 MG tablet Take 10 mg by mouth daily.    [provider]  fluticasone (FLONASE) 50 MCG/ACT nasal spray Place 2 sprays into both nostrils  daily as needed for rhinitis.    [provider]  ipratropium-albuterol (DUONEB) 0.5-2.5 (3) MG/3ML SOLN Take 3 mLs by nebulization every 4 (four) hours. Patient not taking: Reported on 05/16/2022 06/30/15   Hower, Cletis [place], MD  latanoprost (XALATAN) 0.005 % ophthalmic solution Place 1 drop into both eyes at bedtime.    [provider]  losartan (COZAAR) 50 MG tablet Take 100 mg by mouth daily.    [provider]  montelukast (SINGULAIR) 10 MG tablet Take 10 mg by mouth daily. 04/24/22   [provider]  nicotine (NICODERM CQ - DOSED IN MG/24 HOURS) 14 mg/24hr patch Place 1 patch (14 mg total) onto the skin daily. 05/16/22 05/16/23  Pilar Jarvis, MD  nicotine polacrilex (NICOTINE MINI) 4 MG lozenge Take 1 lozenge (4 mg total) by mouth as needed for smoking cessation. 05/16/22   Pilar Jarvis, MD  omeprazole (PRILOSEC) 20 MG capsule Take 20 mg by mouth daily.    [provider]  omeprazole (PRILOSEC) 40 MG capsule Take 40 mg by mouth daily.    [provider]  predniSONE (DELTASONE) 50 MG tablet Take 1 tablet daily for the next 4 days starting on 05/17/2022 05/16/22   Pilar Jarvis, MD  pseudoephedrine-dextromethorphan-guaifenesin (ROBITUSSIN-PE) 30-10-100 MG/5ML solution Take 10 mLs by mouth 4 (four) times daily as needed for cough.    [provider]  rosuvastatin (CRESTOR) 20 MG tablet Take 20 mg by mouth at bedtime.    [provider]  Spacer/Aero-Holding Chambers (AEROCHAMBER MV) inhaler Use as instructed 05/16/22   Pilar Jarvis, MD  [facility] 160-4.5 MCG/ACT inhaler Inhale 2 puffs into the lungs in the morning and at bedtime.    [provider]  tiotropium (SPIRIVA) 18 MCG inhalation capsule Place 18 mcg into inhaler and inhale daily.    [provider]  traZODone (DESYREL) 50 MG tablet Take 50 mg by mouth at bedtime.    [provider]    Physical Exam: Vitals:   11/25/22 1414 11/25/22 1415 11/25/22 1416  11/25/22 1500  BP:  135/83  (!) 122/37  Pulse:  (!) 111  (!) 108  Resp:  (!) 23  (!) 24  Temp:   98.5 F (36.9 C)   TempSrc:   Oral   SpO2:  96%  100%  Weight: 83.9 kg     Height:       Physical Exam Vitals and nursing note reviewed.  Constitutional:      General: She is not in acute distress.    Appearance: She is obese.  HENT:     Head: Normocephalic and atraumatic.     Mouth/Throat:     Mouth: Mucous membranes are moist.     Pharynx: Oropharynx is clear.  Eyes:     Extraocular Movements: Extraocular movements intact.     Pupils: Pupils are equal, round, and reactive  to light.  Cardiovascular:     Rate and Rhythm: Regular rhythm. Tachycardia present.  Pulmonary:     Effort: Pulmonary effort is normal. Tachypnea present.     Breath sounds: Wheezing (Diffuse expiratory wheezing throughout) present. No decreased breath sounds, rhonchi or rales.  Abdominal:     Palpations: Abdomen is soft.     Tenderness: There is no abdominal tenderness.  Musculoskeletal:     Cervical back: Normal range of motion and neck supple.     Right lower leg: No edema.     Left lower leg: No edema.  Skin:    General: Skin is warm and dry.  Neurological:     General: No focal deficit present.     Mental Status: She is alert and oriented to person, place, and time.  Psychiatric:        Mood and Affect: Mood normal.        Behavior: Behavior normal.    Data Reviewed: CBC WBC of 16.3, hemoglobin of 14.8 and platelets of 442 BMP sodium of 137, potassium 3.8, bicarb 24, glucose 112, BUN 11, creatinine 0.58 with GFR above 60 Procalcitonin <0.10 COVID-19, RSV and influenza PCR negative  EKG personally reviewed.  Sinus rhythm with rate of 105.  No ST or T wave changes concerning for acute ischemia.  DG Chest 2 View  Result Date: 11/25/2022 CLINICAL DATA:  Shortness of breath. EXAM: CHEST - 2 VIEW COMPARISON:  May 16, 2022. FINDINGS: The heart size and mediastinal contours are within normal  limits. Both lungs are clear. The visualized skeletal structures are unremarkable. IMPRESSION: No active cardiopulmonary disease. Electronically Signed   By: Lupita Raider M.D.   On: 11/25/2022 10:20    There are no new results to review at this time.  Assessment and Plan:  * COPD with acute exacerbation [facility]) Patient presenting with 2-3-day history of increased SOB and productive cough, in the setting of running out of home Symbicort, although she was using a expired prescription. Procalcitonin is negative and CXR is clear.   - Admit for observation - Continuous pulse oximetry - One-time dose of Azithromycin - S/p Solumedrol 125 mg once via EMS - Continue Prednisone 40 mg daily to complete a 5 day course - Full RVP pending - TOC consultation for home nebulizer machine  Leukocytosis Unclear etiology as chest x-ray is clear and procalcitonin is negative.  Pending full respiratory viral panel.  - Repeat CBC in the a.m. - Full respiratory viral panel pending  Sinus tachycardia Suspect this is due to receiving multiple breathing treatments.  Patient is asymptomatic.  Essential hypertension - Continue home regimen  Advance Care Planning:   Code Status: Full Code Verified by patient with family at bedside  Consults: None  Family Communication: Patient's daughter and sister updated at bedside.   Severity of Illness: The appropriate patient status for this patient is OBSERVATION. Observation status is judged to be reasonable and necessary in order to provide the required intensity of service to ensure the patient's safety. The patient's presenting symptoms, physical exam findings, and initial radiographic and laboratory data in the context of their medical condition is felt to place them at decreased risk for further clinical deterioration. Furthermore, it is anticipated that the patient will be medically stable for discharge from the hospital within 2 midnights of admission.    Author: Verdene Lennert, MD 11/25/2022 3:22 PM  For on call review [email or web address].

## 2022-11-25 NOTE — Assessment & Plan Note (Signed)
Unclear etiology as chest x-ray is clear and procalcitonin is negative.  Pending full respiratory viral panel.  - Repeat CBC in the a.m. - Full respiratory viral panel pending

## 2022-11-25 NOTE — Assessment & Plan Note (Signed)
-   Continue home regimen 

## 2022-11-25 NOTE — ED Triage Notes (Signed)
Pt from home via [facility] for SOB over the weekend which worsened today. Wasn't able to walk well today so son called EMS. Per EMS she had wheezing and rhonchi, O2sat 90% on RA. EMS administered two duonebs, 125 mg of solumedrol. Hx of COPD and asthma. EMS vitals: BP 148/49 HR 118 ST 100% with 6L duoneb treatment.  Pt currently breathing with mild wheezing, speaking in full sentences. 100% RA

## 2022-11-25 NOTE — ED Notes (Signed)
Pt states she ran out of albuterol and Symbicort last Wednesday. Had albuterol refilled but says it was a generic brand that is not as effective as her normal brand. States she normally gets bronchitis this time of year

## 2022-11-25 NOTE — ED Notes (Signed)
Pt ambulated about 200 ft. Denied SOB, HR went to 120 and O2sat stayed at 97%.

## 2022-11-25 NOTE — Assessment & Plan Note (Signed)
Patient presenting with 2-3-day history of increased SOB and productive cough, in the setting of running out of home Symbicort, although she was using a expired prescription. Procalcitonin is negative and CXR is clear.   - Admit for observation - Continuous pulse oximetry - One-time dose of Azithromycin - S/p Solumedrol 125 mg once via EMS - Continue Prednisone 40 mg daily to complete a 5 day course - Full RVP pending - TOC consultation for home nebulizer machine

## 2022-11-25 NOTE — ED Notes (Signed)
Pt ambulated to bathroom. HR increased to 120 and O2 sat dropped to 92%. Pt SOB after walking. HR recovered to 110 and O2 to 99% RA after a minute of rest.

## 2022-11-25 NOTE — Assessment & Plan Note (Signed)
Suspect this is due to receiving multiple breathing treatments.  Patient is asymptomatic.

## 2022-11-26 DIAGNOSIS — R000 Tachycardia, unspecified: Secondary | ICD-10-CM | POA: Diagnosis present

## 2022-11-26 DIAGNOSIS — Z7983 Long term (current) use of bisphosphonates: Secondary | ICD-10-CM | POA: Diagnosis not present

## 2022-11-26 DIAGNOSIS — E785 Hyperlipidemia, unspecified: Secondary | ICD-10-CM | POA: Diagnosis present

## 2022-11-26 DIAGNOSIS — K219 Gastro-esophageal reflux disease without esophagitis: Secondary | ICD-10-CM | POA: Diagnosis present

## 2022-11-26 DIAGNOSIS — I10 Essential (primary) hypertension: Secondary | ICD-10-CM | POA: Diagnosis present

## 2022-11-26 DIAGNOSIS — Z7951 Long term (current) use of inhaled steroids: Secondary | ICD-10-CM | POA: Diagnosis not present

## 2022-11-26 DIAGNOSIS — J441 Chronic obstructive pulmonary disease with (acute) exacerbation: Secondary | ICD-10-CM | POA: Diagnosis present

## 2022-11-26 DIAGNOSIS — D72829 Elevated white blood cell count, unspecified: Secondary | ICD-10-CM | POA: Diagnosis present

## 2022-11-26 DIAGNOSIS — F17210 Nicotine dependence, cigarettes, uncomplicated: Secondary | ICD-10-CM | POA: Diagnosis present

## 2022-11-26 DIAGNOSIS — Z803 Family history of malignant neoplasm of breast: Secondary | ICD-10-CM | POA: Diagnosis not present

## 2022-11-26 DIAGNOSIS — Z6831 Body mass index (BMI) 31.0-31.9, adult: Secondary | ICD-10-CM | POA: Diagnosis not present

## 2022-11-26 DIAGNOSIS — E669 Obesity, unspecified: Secondary | ICD-10-CM | POA: Diagnosis present

## 2022-11-26 DIAGNOSIS — Z79899 Other long term (current) drug therapy: Secondary | ICD-10-CM | POA: Diagnosis not present

## 2022-11-26 DIAGNOSIS — Z1152 Encounter for screening for COVID-19: Secondary | ICD-10-CM | POA: Diagnosis not present

## 2022-11-26 DIAGNOSIS — Z7982 Long term (current) use of aspirin: Secondary | ICD-10-CM | POA: Diagnosis not present

## 2022-11-26 LAB — RESPIRATORY PANEL BY PCR

## 2022-11-26 LAB — URINALYSIS, COMPLETE (UACMP) WITH MICROSCOPIC
Bilirubin Urine: NEGATIVE
Glucose, UA: NEGATIVE mg/dL
Hgb urine dipstick: NEGATIVE
Ketones, ur: NEGATIVE mg/dL
Leukocytes,Ua: NEGATIVE
Nitrite: NEGATIVE
Protein, ur: NEGATIVE mg/dL
RBC / HPF: 0 RBC/hpf (ref 0–5)
Specific Gravity, Urine: 1.023 (ref 1.005–1.030)
pH: 5 (ref 5.0–8.0)

## 2022-11-26 LAB — CBC WITH DIFFERENTIAL/PLATELET
Abs Immature Granulocytes: 0.07 10*3/uL (ref 0.00–0.07)
Basophils Absolute: 0.1 10*3/uL (ref 0.0–0.1)
Basophils Relative: 0 %
Eosinophils Absolute: 0.1 10*3/uL (ref 0.0–0.5)
Eosinophils Relative: 0 %
HCT: 40.8 % (ref 36.0–46.0)
Hemoglobin: 13.3 g/dL (ref 12.0–15.0)
Immature Granulocytes: 0 %
Lymphocytes Relative: 17 %
Lymphs Abs: 2.8 10*3/uL (ref 0.7–4.0)
MCH: 28.5 pg (ref 26.0–34.0)
MCHC: 32.6 g/dL (ref 30.0–36.0)
MCV: 87.6 fL (ref 80.0–100.0)
Monocytes Absolute: 1 10*3/uL (ref 0.1–1.0)
Monocytes Relative: 6 %
Neutro Abs: 12.5 10*3/uL — ABNORMAL HIGH (ref 1.7–7.7)
Neutrophils Relative %: 77 %
Platelets: 451 10*3/uL — ABNORMAL HIGH (ref 150–400)
RBC: 4.66 MIL/uL (ref 3.87–5.11)
RDW: 13 % (ref 11.5–15.5)
WBC: 16.5 10*3/uL — ABNORMAL HIGH (ref 4.0–10.5)
nRBC: 0 % (ref 0.0–0.2)

## 2022-11-26 LAB — HIV ANTIBODY (ROUTINE TESTING W REFLEX): HIV Screen 4th Generation wRfx: NONREACTIVE

## 2022-11-26 MED ORDER — IPRATROPIUM-ALBUTEROL 0.5-2.5 (3) MG/3ML IN SOLN
3.0000 mL | Freq: Two times a day (BID) | RESPIRATORY_TRACT | Status: DC
  Administered 2022-11-27: 3 mL via RESPIRATORY_TRACT
  Filled 2022-11-26: qty 3

## 2022-11-26 MED ORDER — GUAIFENESIN ER 600 MG PO TB12
1200.0000 mg | ORAL_TABLET | Freq: Two times a day (BID) | ORAL | Status: DC
  Administered 2022-11-26 – 2022-11-27 (×2): 1200 mg via ORAL
  Filled 2022-11-26 (×2): qty 2

## 2022-11-26 MED ORDER — ACETAMINOPHEN 325 MG PO TABS
650.0000 mg | ORAL_TABLET | Freq: Four times a day (QID) | ORAL | Status: DC | PRN
  Administered 2022-11-26 – 2022-11-27 (×3): 650 mg via ORAL
  Filled 2022-11-26 (×3): qty 2

## 2022-11-26 MED ORDER — METHYLPREDNISOLONE SODIUM SUCC 40 MG IJ SOLR
40.0000 mg | Freq: Two times a day (BID) | INTRAMUSCULAR | Status: DC
  Administered 2022-11-26 – 2022-11-27 (×3): 40 mg via INTRAVENOUS
  Filled 2022-11-26 (×3): qty 1

## 2022-11-26 MED ORDER — ALBUTEROL SULFATE (2.5 MG/3ML) 0.083% IN NEBU
2.5000 mg | INHALATION_SOLUTION | RESPIRATORY_TRACT | Status: DC | PRN
  Administered 2022-11-27: 2.5 mg via RESPIRATORY_TRACT
  Filled 2022-11-26: qty 3

## 2022-11-26 MED ORDER — AZITHROMYCIN 250 MG PO TABS
500.0000 mg | ORAL_TABLET | Freq: Every day | ORAL | Status: DC
  Administered 2022-11-26 – 2022-11-27 (×2): 500 mg via ORAL
  Filled 2022-11-26: qty 1
  Filled 2022-11-26: qty 2

## 2022-11-26 NOTE — Progress Notes (Signed)
PROGRESS NOTE    Deborah Montgomery  WGN:[id number] DOB: 17-Feb-1950 DOA: 11/25/2022 PCP: Alm Bustard, NP   Brief Narrative: 72 year old with past medical history significant for COPD, hypertension, hyperlipidemia, GERD, presents to the ED complaining of worsening shortness of breath.  She ran out of Symbicort 1 week prior to admission.  Over the last couple of days she developed worsening shortness of breath, increased cough with phlegm production.  EMS was contacted the day prior to admission to go to her home due to worsening shortness of breath she felt better after breathing treatment.  She presented back to the ED with worsening shortness of breath.  On arrival patient oxygen saturation was 100% on room air, respiration rate 20.  White blood cell 16, COVID influenza and RSV PCR negative.  Patient has been admitted for COPD exacerbation.     Assessment & Plan:   Principal Problem:   COPD with acute exacerbation (HCC) Active Problems:   Leukocytosis   Sinus tachycardia   Essential hypertension  1-Acute COPD exacerbation: -Presents with worsening SOB, cough,, ran out Symbicort.  -Chest x ray: No active cardiopulmonary diseases.  -Continue with Duo-nebs.  -On Dulera while in the hospital substitute of Symbicort.  -On Singulair.  -Still having BL wheezing. Plan to continue with IV solumedrol for another 24 hours.  -Continue with Azithromycin.  -Start Guaifenesin.   Leukocytosis:  Chronic Leukocytosis. Needs referral to hematologist out patient.  Now on steroids.  Chest x ray negative.  UA negative.   Sinus Tachycardia In setting Respiratory distress, and Nebulizer treatments. Improved.   Hypertension: Hold  Norvasc.  Continue with Cozaar.     Estimated body mass index is 31.76 kg/m as calculated from the following:   Height as of this encounter: 5\' 4"  (1.626 m).   Weight as of this encounter: 83.9 kg.   DVT prophylaxis: Lovenox Code Status: Full code Family  Communication: care discussed with patient.  Disposition Plan:  Status is: Observation The patient remains OBS appropriate and will d/c before 2 midnights. She will need medications refill.     Consultants:  None  Procedures:  None Antimicrobials:    Subjective: She is breathing better. Still having BL wheezing. Feels congested.   Objective: Vitals:   11/26/22 0600 11/26/22 0622 11/26/22 0630 11/26/22 0730  BP: 127/66  122/69 131/69  Pulse: 89  83 87  Resp: 15  20 19   Temp:  97.9 F (36.6 C)    TempSrc:  Oral    SpO2: 100%  100% 98%  Weight:      Height:        Intake/Output Summary (Last 24 hours) at 11/26/2022 0803 Last data filed at 11/25/2022 2248 Gross per 24 hour  Intake 253 ml  Output --  Net 253 ml   Filed Weights   11/25/22 1414  Weight: 83.9 kg    Examination:  General exam: Appears calm and comfortable  Respiratory system: Respiratory effort normal. BL wheezing.  Cardiovascular system: S1 & S2 heard, RRR.  Gastrointestinal system: Abdomen is nondistended, soft and nontender. No organomegaly or masses felt. Normal bowel sounds heard. Central nervous system: Alert and oriented. No focal neurological deficits. Extremities: Symmetric 5 x 5 power.    Data Reviewed: I have personally reviewed following labs and imaging studies  CBC: Recent Labs  Lab 11/25/22 0913 11/25/22 1430 11/26/22 0630  WBC 16.3* 13.9* 16.5*  NEUTROABS 14.3*  --  12.5*  HGB 14.8 14.9 13.3  HCT 46.4* 47.4* 40.8  MCV 88.5 88.4 87.6  PLT 442* 471* 451*   Basic Metabolic Panel: Recent Labs  Lab 11/25/22 0913 11/25/22 1430  NA 137  --   K 3.8  --   CL 103  --   CO2 24  --   GLUCOSE 112*  --   BUN 11  --   CREATININE 0.58 0.55  CALCIUM 9.1  --    GFR: Estimated Creatinine Clearance: 67.6 mL/min (by C-G formula based on SCr of 0.55 mg/dL). Liver Function Tests: No results for input(s): "AST", "ALT", "ALKPHOS", "BILITOT", "PROT", "ALBUMIN" in the last 168  hours. No results for input(s): "LIPASE", "AMYLASE" in the last 168 hours. No results for input(s): "AMMONIA" in the last 168 hours. Coagulation Profile: No results for input(s): "INR", "PROTIME" in the last 168 hours. Cardiac Enzymes: No results for input(s): "CKTOTAL", "CKMB", "CKMBINDEX", "TROPONINI" in the last 168 hours. BNP (last 3 results) No results for input(s): "PROBNP" in the last 8760 hours. HbA1C: No results for input(s): "HGBA1C" in the last 72 hours. CBG: No results for input(s): "GLUCAP" in the last 168 hours. Lipid Profile: No results for input(s): "CHOL", "HDL", "LDLCALC", "TRIG", "CHOLHDL", "LDLDIRECT" in the last 72 hours. Thyroid Function Tests: No results for input(s): "TSH", "T4TOTAL", "FREET4", "T3FREE", "THYROIDAB" in the last 72 hours. Anemia Panel: No results for input(s): "VITAMINB12", "FOLATE", "FERRITIN", "TIBC", "IRON", "RETICCTPCT" in the last 72 hours. Sepsis Labs: Recent Labs  Lab 11/25/22 0913  PROCALCITON <0.10    Recent Results (from the past 240 hour(s))  Resp panel by RT-PCR (RSV, Flu A&B, Covid) Anterior Nasal Swab     Status: None   Collection Time: 11/25/22  9:13 AM   Specimen: Anterior Nasal Swab  Result Value Ref Range Status   SARS Coronavirus 2 by RT PCR NEGATIVE NEGATIVE Final    Comment: (NOTE) SARS-CoV-2 target nucleic acids are NOT DETECTED.  The SARS-CoV-2 RNA is generally detectable in upper respiratory specimens during the acute phase of infection. The lowest concentration of SARS-CoV-2 viral copies this assay can detect is 138 copies/mL. A negative result does not preclude SARS-Cov-2 infection and should not be used as the sole basis for treatment or other patient management decisions. A negative result may occur with  improper specimen collection/handling, submission of specimen other than nasopharyngeal swab, presence of viral mutation(s) within the areas targeted by this assay, and inadequate number of  viral copies(<138 copies/mL). A negative result must be combined with clinical observations, patient history, and epidemiological information. The expected result is Negative.  Fact Sheet for Patients:  BloggerCourse.com  Fact Sheet for Healthcare Providers:  SeriousBroker.it  This test is no t yet approved or cleared by the [place] FDA and  has been authorized for detection and/or diagnosis of SARS-CoV-2 by FDA under an Emergency Use Authorization (EUA). This EUA will remain  in effect (meaning this test can be used) for the duration of the COVID-19 declaration under Section 564(b)(1) of the Act, 21 U.S.C.section 360bbb-3(b)(1), unless the authorization is terminated  or revoked sooner.       Influenza A by PCR NEGATIVE NEGATIVE Final   Influenza B by PCR NEGATIVE NEGATIVE Final    Comment: (NOTE) The Xpert Xpress SARS-CoV-2/FLU/RSV plus assay is intended as an aid in the diagnosis of influenza from Nasopharyngeal swab specimens and should not be used as a sole basis for treatment. Nasal washings and aspirates are unacceptable for Xpert Xpress SARS-CoV-2/FLU/RSV testing.  Fact Sheet for Patients: BloggerCourse.com  Fact Sheet for Healthcare Providers:  SeriousBroker.it  This test is not yet approved or cleared by the [place] and has been authorized for detection and/or diagnosis of SARS-CoV-2 by FDA under an Emergency Use Authorization (EUA). This EUA will remain in effect (meaning this test can be used) for the duration of the COVID-19 declaration under Section 564(b)(1) of the Act, 21 U.S.C. section 360bbb-3(b)(1), unless the authorization is terminated or revoked.     Resp Syncytial Virus by PCR NEGATIVE NEGATIVE Final    Comment: (NOTE) Fact Sheet for Patients: BloggerCourse.com  Fact Sheet for Healthcare  Providers: SeriousBroker.it  This test is not yet approved or cleared by the [place] FDA and has been authorized for detection and/or diagnosis of SARS-CoV-2 by FDA under an Emergency Use Authorization (EUA). This EUA will remain in effect (meaning this test can be used) for the duration of the COVID-19 declaration under Section 564(b)(1) of the Act, 21 U.S.C. section 360bbb-3(b)(1), unless the authorization is terminated or revoked.  Performed at [facility], [address] [place], [place] [address]   Respiratory (~20 pathogens) panel by PCR     Status: None   Collection Time: 11/25/22  2:30 PM   Specimen: Nasopharyngeal Swab; Respiratory  Result Value Ref Range Status   Adenovirus NOT DETECTED NOT DETECTED Final   Coronavirus 229E NOT DETECTED NOT DETECTED Final    Comment: (NOTE) The Coronavirus on the Respiratory Panel, DOES NOT test for the novel  Coronavirus (2019 nCoV)    Coronavirus HKU1 NOT DETECTED NOT DETECTED Final   Coronavirus NL63 NOT DETECTED NOT DETECTED Final   Coronavirus OC43 NOT DETECTED NOT DETECTED Final   Metapneumovirus NOT DETECTED NOT DETECTED Final   Rhinovirus / Enterovirus NOT DETECTED NOT DETECTED Final   Influenza A NOT DETECTED NOT DETECTED Final   Influenza B NOT DETECTED NOT DETECTED Final   Parainfluenza Virus 1 NOT DETECTED NOT DETECTED Final   Parainfluenza Virus 2 NOT DETECTED NOT DETECTED Final   Parainfluenza Virus 3 NOT DETECTED NOT DETECTED Final   Parainfluenza Virus 4 NOT DETECTED NOT DETECTED Final   Respiratory Syncytial Virus NOT DETECTED NOT DETECTED Final   Bordetella pertussis NOT DETECTED NOT DETECTED Final   Bordetella Parapertussis NOT DETECTED NOT DETECTED Final   Chlamydophila pneumoniae NOT DETECTED NOT DETECTED Final   Mycoplasma pneumoniae NOT DETECTED NOT DETECTED Final    Comment: Performed at [facility], [address], [place], [place] [address]          Radiology Studies: DG Chest 2 View  Result Date: 11/25/2022 CLINICAL DATA:  Shortness of breath. EXAM: CHEST - 2 VIEW COMPARISON:  May 16, 2022. FINDINGS: The heart size and mediastinal contours are within normal limits. Both lungs are clear. The visualized skeletal structures are unremarkable. IMPRESSION: No active cardiopulmonary disease. Electronically Signed   By: Lupita Raider M.D.   On: 11/25/2022 10:20        Scheduled Meds:  amLODipine  10 mg Oral Daily   enoxaparin (LOVENOX) injection  40 mg Subcutaneous Q24H   ezetimibe  10 mg Oral Daily   ipratropium-albuterol  3 mL Nebulization Q6H   latanoprost  1 drop Both Eyes QHS   losartan  100 mg Oral Daily   mometasone-formoterol  2 puff Inhalation BID   montelukast  10 mg Oral QHS   pantoprazole  40 mg Oral Daily   predniSONE  40 mg Oral Q breakfast   rosuvastatin  20 mg Oral QHS   sodium chloride flush  3 mL Intravenous Q12H   Continuous Infusions:   LOS: 0 days    Time spent: 35 minutes.     Alba Cory, MD [facility]   If 7PM-7AM, please contact night-coverage [email or web address]  11/26/2022, 8:03 AM

## 2022-11-26 NOTE — Plan of Care (Signed)

## 2022-11-27 DIAGNOSIS — J441 Chronic obstructive pulmonary disease with (acute) exacerbation: Secondary | ICD-10-CM | POA: Diagnosis not present

## 2022-11-27 LAB — GLUCOSE, CAPILLARY
Glucose-Capillary: 108 mg/dL — ABNORMAL HIGH (ref 70–99)
Glucose-Capillary: 116 mg/dL — ABNORMAL HIGH (ref 70–99)

## 2022-11-27 MED ORDER — PREDNISONE 50 MG PO TABS: 50.0000 mg | ORAL_TABLET | Freq: Every day | ORAL | 0 refills | Status: DC

## 2022-11-27 MED ORDER — SYMBICORT 160-4.5 MCG/ACT IN AERO: 2.0000 | INHALATION_SPRAY | Freq: Two times a day (BID) | RESPIRATORY_TRACT | 0 refills | Status: DC

## 2022-11-27 MED ORDER — AZITHROMYCIN 250 MG PO TABS: 250.0000 mg | ORAL_TABLET | Freq: Every day | ORAL | 0 refills | Status: AC

## 2022-11-27 MED ORDER — ALBUTEROL SULFATE (2.5 MG/3ML) 0.083% IN NEBU: 2.5000 mg | INHALATION_SOLUTION | RESPIRATORY_TRACT | 0 refills | Status: AC | PRN

## 2022-11-27 NOTE — TOC Initial Note (Signed)
Transition of Care [facility]) - Initial/Assessment Note    Patient Details  Name: Deborah Montgomery MRN: [id number] Date of Birth: [birth year]  Transition [facility] Care [facility]) CM/SW Contact:    Margarito Liner, LCSW Phone Number: 11/27/2022, 9:49 AM  Clinical Narrative:  CSW met with patient. Daughter at bedside. CSW introduced role and explained that discharge planning would be discussed. Patient is aware of recommendation for nebulizer. Reviewed different agencies. She prefers [employer]. CSW called and ordered nebulizer machine. Patient stated plan is to discharge today. No further concerns.  Expected Discharge Plan: Home/Self Care Barriers to Discharge: Continued Medical Work up   Patient Goals and CMS Choice     Choice offered to / list presented to : Patient      Expected Discharge Plan and Services     Post Acute Care Choice: Durable Medical Equipment Living arrangements for the past 2 months: Single Family Home                 DME Arranged: Nebulizer machine DME Agency: [employer] Date DME Agency Contacted: 11/27/22   Representative spoke with at DME Agency: Mickeal Needy            Prior Living Arrangements/Services Living arrangements for the past 2 months: Single Family Home   Patient language and need for interpreter reviewed:: Yes Do you feel safe going back to the place where you live?: Yes      Need for Family Participation in Patient Care: Yes (Comment)     Criminal Activity/Legal Involvement Pertinent to Current Situation/Hospitalization: No - Comment as needed  Activities of Daily Living   ADL Screening (condition at time of admission) Independently performs ADLs?: Yes (appropriate for developmental age) Is the patient deaf or have difficulty hearing?: No Does the patient have difficulty seeing, even when wearing glasses/contacts?: No Does the patient have difficulty concentrating, remembering, or making decisions?: No  Permission Sought/Granted Permission sought  to share information with : Family Supports          Permission granted to share info w Relationship: Daughter at bedside     Emotional Assessment Appearance:: Appears stated age Attitude/Demeanor/Rapport: Engaged, Gracious Affect (typically observed): Accepting, Appropriate, Calm, Pleasant Orientation: : Oriented to Self, Oriented to Place, Oriented to Situation, Oriented to  Time Alcohol / Substance Use: Not Applicable Psych Involvement: No (comment)  Admission diagnosis:  COPD exacerbation (HCC) [J44.1] COPD with acute exacerbation (HCC) [J44.1] Patient Active Problem List   Diagnosis Date Noted   Leukocytosis 11/25/2022   Sinus tachycardia 11/25/2022   COPD with acute exacerbation (HCC) 05/17/2022   Encounter for smoking cessation counseling 05/17/2022   Hypoxia 05/17/2022   COPD exacerbation (HCC) 05/17/2022   Acute exacerbation of chronic obstructive pulmonary disease (COPD) (HCC) 05/16/2022   NSTEMI (non-ST elevated myocardial infarction) (HCC) 06/30/2015   Elevated troponin 06/29/2015   Essential hypertension    HLD (hyperlipidemia)    COPD with exacerbation (HCC) 06/28/2015   Tobacco abuse 06/28/2015   Anxiety 06/28/2015   PCP:  Alm Bustard, NP Pharmacy:   [facility] [phone] [address], [place] - [address] AT [facility] [address] [place] [address] Phone: [phone] Fax: [phone]  [facility] [address]), [place] - [address] [place]) [place] [address] Phone: [phone] Fax: [phone]     Social Determinants of Health (SDOH) Social History: SDOH Screenings   Food Insecurity: No Food Insecurity (11/25/2022)  Housing: Low Risk  (11/25/2022)  Transportation Needs: No Transportation Needs (11/25/2022)  Utilities: Not At Risk (11/25/2022)  Financial Resource Strain: Low Risk  (05/30/2022)   Received from [facility], [facility]   Physical Activity: Sufficiently Active (08/09/2020)   Received from [facility], [facility]  Social Connections: Moderately Integrated (08/09/2020)   Received from [facility], [facility]  Stress: No Stress Concern Present (08/09/2020)   Received from [facility], [facility]  Tobacco Use: High Risk (11/25/2022)  Health Literacy: Low Risk  (08/09/2020)   Received from [facility], [facility]   SDOH Interventions:     Readmission Risk Interventions     No data to display

## 2022-11-27 NOTE — TOC Transition Note (Signed)
Transition of Care [facility]) - CM/SW Discharge Note   Patient Details  Name: Deborah Montgomery MRN: [id number] Date of Birth: [birth year]  Transition of Care [facility]) CM/SW Contact:  Margarito Liner, LCSW Phone Number: 11/27/2022, 11:39 AM   Clinical Narrative:   Patient has orders to discharge home today. No further concerns. CSW signing off.  Final next level of care: Home/Self Care Barriers to Discharge: Barriers Resolved   Patient Goals and CMS Choice   Choice offered to / list presented to : Patient  Discharge Placement                      Patient and family notified of of transfer: 11/27/22  Discharge Plan and Services Additional resources added to the After Visit Summary for       Post Acute Care Choice: Durable Medical Equipment          DME Arranged: Nebulizer machine DME Agency: [employer] Date DME Agency Contacted: 11/27/22   Representative spoke with at DME Agency: Mickeal Needy            Social Determinants of Health (SDOH) Interventions SDOH Screenings   Food Insecurity: No Food Insecurity (11/25/2022)  Housing: Low Risk  (11/25/2022)  Transportation Needs: No Transportation Needs (11/25/2022)  Utilities: Not At Risk (11/25/2022)  Financial Resource Strain: Low Risk  (05/30/2022)   Received from [facility], [facility]  Physical Activity: Sufficiently Active (08/09/2020)   [facility], [facility]  Social Connections: Moderately Integrated (08/09/2020)   Received from [facility], [facility]  Stress: No Stress Concern Present (08/09/2020)   Received from [facility], [facility]  Tobacco Use: High Risk (11/25/2022)  Health Literacy: Low Risk  (08/09/2020)   Received from [facility], [facility]     Readmission Risk Interventions     No data to display

## 2022-11-27 NOTE — Discharge Summary (Signed)
Physician Discharge Summary   Patient: Deborah Montgomery MRN: [id number] DOB: Aug 17, 1950  Admit date:     11/25/2022  Discharge date:   Discharge Physician: Tyrone Nine   PCP: Alm Bustard, NP   Recommendations at discharge:  Follow up with PCP in 1-2 weeks. Consider formal PFTs, pulmonary follow up after convalescence.  If leukocytosis remains after convalescence (on steroids currently), consider hematology follow up.   Discharge Diagnoses: Principal Problem:   COPD with acute exacerbation (HCC) Active Problems:   Leukocytosis   Sinus tachycardia   Essential hypertension   COPD exacerbation [facility])  Hospital Course: 72 year old with past medical history significant for COPD, hypertension, hyperlipidemia, GERD, presents to the ED complaining of worsening shortness of breath.  She ran out of Symbicort 1 week prior to admission.  Over the last couple of days she developed worsening shortness of breath, increased cough with phlegm production.  EMS was contacted the day prior to admission to go to her home due to worsening shortness of breath she felt better after breathing treatment.  She presented back to the ED with worsening shortness of breath.   On arrival patient oxygen saturation was 100% on room air, respiration rate 20.  White blood cell 16, COVID influenza and RSV PCR negative.  Patient has been admitted for COPD exacerbation.  With treatment, her respiratory status has returned to baseline, wheezing resolved, and she is stable for discharge with plans to complete treatment orally at home and have new nebulizer machine provided.   Assessment and Plan: Acute COPD exacerbation:  - Continue prn albuterol neb at home (new machine/meds ordered) - Refill Symbicort.  - Continue Singulair.  - Taper to oral prednisone to complete 5 day course. Chest x ray: No active cardiopulmonary diseases.  -Continue with Azithromycin x5 days, PCT neg. RSV, flu, covid negative, viral panel negative.   - Tobacco cessation   Leukocytosis:  Chronic Leukocytosis. Needs referral to hematologist out patient.  Now on steroids.  Chest x ray negative.  UA negative.    Sinus Tachycardia In setting Respiratory distress, and Nebulizer treatments. Improved.    Hypertension: Continue home Tx.   Obesity: Estimated body mass index is 31.76 kg/m  Consultants: None Procedures performed: None  Disposition: Home Diet recommendation:  Cardiac diet DISCHARGE MEDICATION: Allergies as of 11/27/2022   No Known Allergies      Medication List     STOP taking these medications    ipratropium-albuterol 0.5-2.5 (3) MG/3ML Soln Commonly known as: DUONEB       TAKE these medications    AeroChamber MV inhaler Use as instructed   albuterol 108 (90 Base) MCG/ACT inhaler Commonly known as: VENTOLIN HFA Inhale 2 puffs into the lungs every 6 (six) hours as needed for wheezing or shortness of breath. What changed: Another medication with the same name was added. Make sure you understand how and when to take each.   albuterol (2.5 MG/3ML) 0.083% nebulizer solution Commonly known as: PROVENTIL Take 3 mLs (2.5 mg total) by nebulization every 4 (four) hours as needed for wheezing or shortness of breath. What changed: You were already taking a medication with the same name, and this prescription was added. Make sure you understand how and when to take each.   alendronate 70 MG tablet Commonly known as: FOSAMAX Take 70 mg by mouth once a week.   amLODipine 5 MG tablet Commonly known as: NORVASC Take 10 mg by mouth daily.   aspirin EC 81 MG tablet Take  1 tablet (81 mg total) by mouth daily.   azithromycin 250 MG tablet Commonly known as: ZITHROMAX Take 1 tablet (250 mg total) by mouth daily for 2 days.   diclofenac sodium 1 % Gel Commonly known as: VOLTAREN Apply 2 g topically 4 (four) times daily as needed (for shoulder pain).   ezetimibe 10 MG tablet Commonly known as: ZETIA Take 10  mg by mouth daily.   fluticasone 50 MCG/ACT nasal spray Commonly known as: FLONASE Place 2 sprays into both nostrils daily as needed for rhinitis.   ibuprofen 800 MG tablet Commonly known as: ADVIL Take 800 mg by mouth every 6 (six) hours as needed.   latanoprost 0.005 % ophthalmic solution Commonly known as: XALATAN Place 1 drop into both eyes at bedtime.   losartan 50 MG tablet Commonly known as: COZAAR Take 100 mg by mouth daily.   montelukast 10 MG tablet Commonly known as: SINGULAIR Take 10 mg by mouth daily.   nicotine 14 mg/24hr patch Commonly known as: NICODERM CQ - dosed in mg/24 hours Place 1 patch (14 mg total) onto the skin daily.   nicotine polacrilex 4 MG lozenge Commonly known as: Nicotine Mini Take 1 lozenge (4 mg total) by mouth as needed for smoking cessation.   omeprazole 40 MG capsule Commonly known as: PRILOSEC Take 40 mg by mouth daily.   predniSONE 50 MG tablet Commonly known as: DELTASONE Take 1 tablet (50 mg total) by mouth daily with breakfast for 2 days. Start taking on: November 28, 2022 What changed:  how much to take how to take this when to take this additional instructions   pseudoephedrine-dextromethorphan-guaifenesin 30-10-100 MG/5ML solution Commonly known as: ROBITUSSIN-PE Take 10 mLs by mouth 4 (four) times daily as needed for cough.   rosuvastatin 20 MG tablet Commonly known as: CRESTOR Take 20 mg by mouth at bedtime.   Symbicort 160-4.5 MCG/ACT inhaler Generic drug: budesonide-formoterol Inhale 2 puffs into the lungs in the morning and at bedtime.   tiotropium 18 MCG inhalation capsule Commonly known as: SPIRIVA Place 18 mcg into inhaler and inhale daily.   traZODone 50 MG tablet Commonly known as: DESYREL Take 50 mg by mouth at bedtime.   Vitamin D (Ergocalciferol) 1.25 MG (50000 UNIT) Caps capsule Commonly known as: DRISDOL Take 50,000 Units by mouth once a week.               Durable Medical Equipment   (From admission, onward)           Start     Ordered   11/26/22 0813  For home use only DME Nebulizer machine  Once       Question Answer Comment  Patient needs a nebulizer to treat with the following condition COPD exacerbation (HCC)   Length of Need Lifetime      11/26/22 4098            Follow-up Information     Fields, Lisabeth Pick, NP Follow up.   Specialty: Family Medicine Contact information: [address] [place] [address] [phone]                Discharge Exam: Ceasar Mons Weights   11/25/22 1414  Weight: 83.9 kg  BP (!) 161/81 (BP Location: Right Arm)   Pulse 94   Temp 98.6 F (37 C) (Oral)   Resp 18   Ht 5\' 4"  (1.626 m)   Wt 83.9 kg   SpO2 96%   BMI 31.76 kg/m   Well-appearing  older female in no distress, daughter at bedside. Reports her breathing is much better even from yesterday when she wanted to go home. No dyspnea even when up and walking around.  No wheezing, crackles, normal WOB  Condition at discharge: stable  The results of significant diagnostics from this hospitalization (including imaging, microbiology, ancillary and laboratory) are listed below for reference.   Imaging Studies: DG Chest 2 View  Result Date: 11/25/2022 CLINICAL DATA:  Shortness of breath. EXAM: CHEST - 2 VIEW COMPARISON:  May 16, 2022. FINDINGS: The heart size and mediastinal contours are within normal limits. Both lungs are clear. The visualized skeletal structures are unremarkable. IMPRESSION: No active cardiopulmonary disease. Electronically Signed   By: Lupita Raider M.D.   On: 11/25/2022 10:20    Microbiology: Results for orders placed or performed during the hospital encounter of 11/25/22  Resp panel by RT-PCR (RSV, Flu A&B, Covid) Anterior Nasal Swab     Status: None   Collection Time: 11/25/22  9:13 AM   Specimen: Anterior Nasal Swab  Result Value Ref Range Status   SARS Coronavirus 2 by RT PCR NEGATIVE NEGATIVE Final    Comment:  (NOTE) SARS-CoV-2 target nucleic acids are NOT DETECTED.  The SARS-CoV-2 RNA is generally detectable in upper respiratory specimens during the acute phase of infection. The lowest concentration of SARS-CoV-2 viral copies this assay can detect is 138 copies/mL. A negative result does not preclude SARS-Cov-2 infection and should not be used as the sole basis for treatment or other patient management decisions. A negative result may occur with  improper specimen collection/handling, submission of specimen other than nasopharyngeal swab, presence of viral mutation(s) within the areas targeted by this assay, and inadequate number of viral copies(<138 copies/mL). A negative result must be combined with clinical observations, patient history, and epidemiological information. The expected result is Negative.  Fact Sheet for Patients:  BloggerCourse.com  Fact Sheet for Healthcare Providers:  SeriousBroker.it  This test is no t yet approved or cleared by the [place] FDA and  has been authorized for detection and/or diagnosis of SARS-CoV-2 by FDA under an Emergency Use Authorization (EUA). This EUA will remain  in effect (meaning this test can be used) for the duration of the COVID-19 declaration under Section 564(b)(1) of the Act, 21 U.S.C.section 360bbb-3(b)(1), unless the authorization is terminated  or revoked sooner.       Influenza A by PCR NEGATIVE NEGATIVE Final   Influenza B by PCR NEGATIVE NEGATIVE Final    Comment: (NOTE) The Xpert Xpress SARS-CoV-2/FLU/RSV plus assay is intended as an aid in the diagnosis of influenza from Nasopharyngeal swab specimens and should not be used as a sole basis for treatment. Nasal washings and aspirates are unacceptable for Xpert Xpress SARS-CoV-2/FLU/RSV testing.  Fact Sheet for Patients: BloggerCourse.com  Fact Sheet for Healthcare  Providers: SeriousBroker.it  This test is not yet approved or cleared by the [place] FDA and has been authorized for detection and/or diagnosis of SARS-CoV-2 by FDA under an Emergency Use Authorization (EUA). This EUA will remain in effect (meaning this test can be used) for the duration of the COVID-19 declaration under Section 564(b)(1) of the Act, 21 U.S.C. section 360bbb-3(b)(1), unless the authorization is terminated or revoked.     Resp Syncytial Virus by PCR NEGATIVE NEGATIVE Final    Comment: (NOTE) Fact Sheet for Patients: BloggerCourse.com  Fact Sheet for Healthcare Providers: SeriousBroker.it  This test is not yet approved or cleared by the [place] and  has been authorized for detection and/or diagnosis of SARS-CoV-2 by FDA under an Emergency Use Authorization (EUA). This EUA will remain in effect (meaning this test can be used) for the duration of the COVID-19 declaration under Section 564(b)(1) of the Act, 21 U.S.C. section 360bbb-3(b)(1), unless the authorization is terminated or revoked.  Performed at [facility], [address] [place], [place] [address]   Respiratory (~20 pathogens) panel by PCR     Status: None   Collection Time: 11/25/22  2:30 PM   Specimen: Nasopharyngeal Swab; Respiratory  Result Value Ref Range Status   Adenovirus NOT DETECTED NOT DETECTED Final   Coronavirus 229E NOT DETECTED NOT DETECTED Final    Comment: (NOTE) The Coronavirus on the Respiratory Panel, DOES NOT test for the novel  Coronavirus (2019 nCoV)    Coronavirus HKU1 NOT DETECTED NOT DETECTED Final   Coronavirus NL63 NOT DETECTED NOT DETECTED Final   Coronavirus OC43 NOT DETECTED NOT DETECTED Final   Metapneumovirus NOT DETECTED NOT DETECTED Final   Rhinovirus / Enterovirus NOT DETECTED NOT DETECTED Final   Influenza A NOT DETECTED NOT DETECTED Final   Influenza B NOT  DETECTED NOT DETECTED Final   Parainfluenza Virus 1 NOT DETECTED NOT DETECTED Final   Parainfluenza Virus 2 NOT DETECTED NOT DETECTED Final   Parainfluenza Virus 3 NOT DETECTED NOT DETECTED Final   Parainfluenza Virus 4 NOT DETECTED NOT DETECTED Final   Respiratory Syncytial Virus NOT DETECTED NOT DETECTED Final   Bordetella pertussis NOT DETECTED NOT DETECTED Final   Bordetella Parapertussis NOT DETECTED NOT DETECTED Final   Chlamydophila pneumoniae NOT DETECTED NOT DETECTED Final   Mycoplasma pneumoniae NOT DETECTED NOT DETECTED Final    Comment: Performed at [facility], [address], [place], [place] [address]    Labs: CBC: Recent Labs  Lab 11/25/22 0913 11/25/22 1430 11/26/22 0630  WBC 16.3* 13.9* 16.5*  NEUTROABS 14.3*  --  12.5*  HGB 14.8 14.9 13.3  HCT 46.4* 47.4* 40.8  MCV 88.5 88.4 87.6  PLT 442* 471* 451*   Basic Metabolic Panel: Recent Labs  Lab 11/25/22 0913 11/25/22 1430  NA 137  --   K 3.8  --   CL 103  --   CO2 24  --   GLUCOSE 112*  --   BUN 11  --   CREATININE 0.58 0.55  CALCIUM 9.1  --    Liver Function Tests: No results for input(s): "AST", "ALT", "ALKPHOS", "BILITOT", "PROT", "ALBUMIN" in the last 168 hours. CBG: Recent Labs  Lab 11/27/22 0847  GLUCAP 108*    Discharge time spent: greater than 30 minutes.  Signed: Tyrone Nine, MD [facility] 11/27/2022

## 2022-11-27 NOTE — Plan of Care (Signed)
The patient has been discharged. IV has been removed. Education has been completed with the patient. Nebulizer machine has been provided to the patient. The patient was been wheeled down to his vehicle.  Problem: Education: Goal: Knowledge of General Education information will improve Description: Including pain rating scale, medication(s)/side effects and non-pharmacologic comfort measures Outcome: Completed/Met   Problem: Education: Goal: Knowledge of General Education information will improve Description: Including pain rating scale, medication(s)/side effects and non-pharmacologic comfort measures Outcome: Completed/Met   Problem: Health Behavior/Discharge Planning: Goal: Ability to manage health-related needs will improve Outcome: Completed/Met   Problem: Clinical Measurements: Goal: Ability to maintain clinical measurements within normal limits will improve Outcome: Completed/Met Goal: Will remain free from infection Outcome: Completed/Met Goal: Diagnostic test results will improve Outcome: Completed/Met Goal: Respiratory complications will improve Outcome: Completed/Met Goal: Cardiovascular complication will be avoided Outcome: Completed/Met   Problem: Activity: Goal: Risk for activity intolerance will decrease Outcome: Completed/Met   Problem: Nutrition: Goal: Adequate nutrition will be maintained Outcome: Completed/Met   Problem: Coping: Goal: Level of anxiety will decrease Outcome: Completed/Met   Problem: Elimination: Goal: Will not experience complications related to bowel motility Outcome: Completed/Met Goal: Will not experience complications related to urinary retention Outcome: Completed/Met   Problem: Pain Management: Goal: General experience of comfort will improve Outcome: Completed/Met   Problem: Safety: Goal: Ability to remain free from injury will improve Outcome: Completed/Met   Problem: Skin Integrity: Goal: Risk for impaired skin integrity  will decrease Outcome: Completed/Met

## 2022-12-09 ENCOUNTER — Inpatient Hospital Stay: Payer: 59 | Attending: Oncology | Admitting: Oncology

## 2022-12-09 ENCOUNTER — Encounter: Payer: Self-pay | Admitting: Oncology

## 2022-12-09 ENCOUNTER — Inpatient Hospital Stay: Payer: 59

## 2022-12-09 VITALS — BP 147/73 | HR 90 | Temp 98.2°F | Resp 18 | Wt 195.0 lb

## 2022-12-09 DIAGNOSIS — D72828 Other elevated white blood cell count: Secondary | ICD-10-CM | POA: Diagnosis not present

## 2022-12-09 DIAGNOSIS — D75839 Thrombocytosis, unspecified: Secondary | ICD-10-CM | POA: Insufficient documentation

## 2022-12-09 DIAGNOSIS — F17210 Nicotine dependence, cigarettes, uncomplicated: Secondary | ICD-10-CM | POA: Insufficient documentation

## 2022-12-09 LAB — CBC WITH DIFFERENTIAL/PLATELET
Abs Immature Granulocytes: 0.09 10*3/uL — ABNORMAL HIGH (ref 0.00–0.07)
Basophils Absolute: 0.1 10*3/uL (ref 0.0–0.1)
Basophils Relative: 1 %
Eosinophils Absolute: 0.4 10*3/uL (ref 0.0–0.5)
Eosinophils Relative: 2 %
HCT: 42.6 % (ref 36.0–46.0)
Hemoglobin: 13.8 g/dL (ref 12.0–15.0)
Immature Granulocytes: 1 %
Lymphocytes Relative: 16 %
Lymphs Abs: 2.9 10*3/uL (ref 0.7–4.0)
MCH: 28.5 pg (ref 26.0–34.0)
MCHC: 32.4 g/dL (ref 30.0–36.0)
MCV: 87.8 fL (ref 80.0–100.0)
Monocytes Absolute: 0.8 10*3/uL (ref 0.1–1.0)
Monocytes Relative: 4 %
Neutro Abs: 14.1 10*3/uL — ABNORMAL HIGH (ref 1.7–7.7)
Neutrophils Relative %: 76 %
Platelets: 454 10*3/uL — ABNORMAL HIGH (ref 150–400)
RBC: 4.85 MIL/uL (ref 3.87–5.11)
RDW: 12.7 % (ref 11.5–15.5)
WBC: 18.4 10*3/uL — ABNORMAL HIGH (ref 4.0–10.5)
nRBC: 0 % (ref 0.0–0.2)

## 2022-12-09 NOTE — Progress Notes (Signed)
Hematology/Oncology Consult note [facility] [phone] [phone]  Patient Care Team: Alm Bustard, NP as PCP - General (Family Medicine)   Name of the patient: Deborah Montgomery  [id number]  01/16/1951    Reason for referral- leucocytosis    Referring physician-Glenda Fields NP  Date of visit: 12/09/22   History of presenting illness- patient is a 72 year old female with a past medical history significant for hypertension hyperlipidemia anxiety COPD among other medical problems .  She has been referred to [place] for leukocytosis.  White cell count on 12/03/2022 was 17.9 with an H&H of 13.8/41.5 and a platelet count of 477.  Looking back at her prior CBCs her white count has been fluctuating between 12-17 over the last 1 year and differential mainly shows neutrophilia.  She has had chronic leukocytosis/neutrophilia at least dating back to 2017 without a clear rising trend.  Platelet counts have been in the 400s since April 2024  Overall patient feels well other than occasional flareups of her asthma she denies any complaints at this time  ECOG PS- 1  Pain scale- 0   Review of systems- Review of Systems  Constitutional:  Negative for chills, fever, malaise/fatigue and weight loss.  HENT:  Negative for congestion, ear discharge and nosebleeds.   Eyes:  Negative for blurred vision.  Respiratory:  Negative for cough, hemoptysis, sputum production, shortness of breath and wheezing.   Cardiovascular:  Negative for chest pain, palpitations, orthopnea and claudication.  Gastrointestinal:  Negative for abdominal pain, blood in stool, constipation, diarrhea, heartburn, melena, nausea and vomiting.  Genitourinary:  Negative for dysuria, flank pain, frequency, hematuria and urgency.  Musculoskeletal:  Negative for back pain, joint pain and myalgias.  Skin:  Negative for rash.  Neurological:  Negative for dizziness, tingling, focal weakness, seizures,  weakness and headaches.  Endo/Heme/Allergies:  Does not bruise/bleed easily.  Psychiatric/Behavioral:  Negative for depression and suicidal ideas. The patient does not have insomnia.     No Known Allergies  Patient Active Problem List   Diagnosis Date Noted   Leukocytosis 11/25/2022   Sinus tachycardia 11/25/2022   COPD with acute exacerbation (HCC) 05/17/2022   Encounter for smoking cessation counseling 05/17/2022   Hypoxia 05/17/2022   COPD exacerbation (HCC) 05/17/2022   Acute exacerbation of chronic obstructive pulmonary disease (COPD) (HCC) 05/16/2022   NSTEMI (non-ST elevated myocardial infarction) (HCC) 06/30/2015   Elevated troponin 06/29/2015   Essential hypertension    HLD (hyperlipidemia)    COPD with exacerbation (HCC) 06/28/2015   Tobacco abuse 06/28/2015   Anxiety 06/28/2015     Past Medical History:  Diagnosis Date   Anxiety    Arthritis    Asthma    COPD (chronic obstructive pulmonary disease) (HCC)    a. diagnosed 05/2015   Essential hypertension    Eye problem    GERD (gastroesophageal reflux disease)    HLD (hyperlipidemia)    a. intolerant to simvastatin    Tobacco abuse      Past Surgical History:  Procedure Laterality Date   ABDOMINAL HYSTERECTOMY     CARDIAC CATHETERIZATION N/A 06/30/2015   Procedure: Left Heart Cath and Coronary Angiography;  Surgeon: Iran Ouch, MD;  Location: [facility] INVASIVE CV LAB;  Service: Cardiovascular;  Laterality: N/A;   CARPAL TUNNEL RELEASE      Social History   Socioeconomic History   Marital status: Widowed    Spouse name: Not on file   Number of children: Not on file  Years of education: Not on file   Highest education level: Not on file  Occupational History   Not on file  Tobacco Use   Smoking status: Every Day    Types: Cigarettes   Smokeless tobacco: Not on file  Substance and Sexual Activity   Alcohol use: No   Drug use: No   Sexual activity: Not on file  Other Topics Concern   Not on  file  Social History Narrative   Not on file   Social Determinants of Health   Financial Resource Strain: Low Risk  (12/03/2022)   Received [facility]   Overall Financial Resource Strain (CARDIA)    Difficulty of Paying Living Expenses: Not hard at all  Food Insecurity: No Food Insecurity (12/03/2022)   Received from [facility]   Hunger Vital Sign    Worried About Running Out of Food in the Last Year: Never true    Ran Out of Food in the Last Year: Never true  Transportation Needs: No Transportation Needs (12/03/2022)   Received from [facility] - Transportation    In the past 12 months, has lack of transportation kept you from medical appointments or from getting medications?: No    Lack of Transportation (Non-Medical): No  Physical Activity: Sufficiently Active (08/09/2020)   Received from [facility], [facility]   Exercise Vital Sign    Days of Exercise per Week: 4 days    Minutes of Exercise per Session: 40 min  Stress: No Stress Concern Present (08/09/2020)   Received from [facility], [facility] - Occupational Stress Questionnaire    Feeling of Stress : Not at all  Social Connections: Moderately Integrated (08/09/2020)   Received from [facility], [facility]   Social Connection and Isolation Panel [NHANES]    Frequency of Communication with Friends and Family: More than three times a week    Frequency of Social Gatherings with Friends and Family: More than three times a week    Attends Religious Services: 1 to 4 times per year    Active Member of [employer] or Organizations: Yes    Attends Banker Meetings: 1 to 4 times per year    Marital Status: Widowed  Intimate Partner Violence: Not At Risk (11/25/2022)   Humiliation, Afraid, Rape, and Kick questionnaire    Fear of Current or Ex-Partner: No    Emotionally Abused: No     Physically Abused: No    Sexually Abused: No     Family History  Problem Relation Age of Onset   Breast cancer Paternal Aunt    Breast cancer Paternal Aunt      Current Outpatient Medications:    albuterol (PROVENTIL) (2.5 MG/3ML) 0.083% nebulizer solution, Take 3 mLs (2.5 mg total) by nebulization every 4 (four) hours as needed for wheezing or shortness of breath., Disp: 75 mL, Rfl: 0   albuterol (VENTOLIN HFA) 108 (90 Base) MCG/ACT inhaler, Inhale 2 puffs into the lungs every 6 (six) hours as needed for wheezing or shortness of breath., Disp: 8 g, Rfl: 2   alendronate (FOSAMAX) 70 MG tablet, Take 70 mg by mouth once a week., Disp: , Rfl:    amLODipine (NORVASC) 5 MG tablet, Take 10 mg by mouth daily., Disp: , Rfl:    aspirin EC 81 MG EC tablet, Take 1 tablet (81 mg total) by mouth daily. (  Patient not taking: Reported on 05/16/2022), Disp: 30 tablet, Rfl: 0   diclofenac sodium (VOLTAREN) 1 % GEL, Apply 2 g topically 4 (four) times daily as needed (for shoulder pain)., Disp: , Rfl:    ezetimibe (ZETIA) 10 MG tablet, Take 10 mg by mouth daily., Disp: , Rfl:    fluticasone (FLONASE) 50 MCG/ACT nasal spray, Place 2 sprays into both nostrils daily as needed for rhinitis., Disp: , Rfl:    ibuprofen (ADVIL) 800 MG tablet, Take 800 mg by mouth every 6 (six) hours as needed., Disp: , Rfl:    latanoprost (XALATAN) 0.005 % ophthalmic solution, Place 1 drop into both eyes at bedtime., Disp: , Rfl:    losartan (COZAAR) 50 MG tablet, Take 100 mg by mouth daily., Disp: , Rfl:    montelukast (SINGULAIR) 10 MG tablet, Take 10 mg by mouth daily., Disp: , Rfl:    nicotine (NICODERM CQ - DOSED IN MG/24 HOURS) 14 mg/24hr patch, Place 1 patch (14 mg total) onto the skin daily., Disp: 90 patch, Rfl: 3   nicotine polacrilex (NICOTINE MINI) 4 MG lozenge, Take 1 lozenge (4 mg total) by mouth as needed for smoking cessation., Disp: 100 tablet, Rfl: 0   omeprazole (PRILOSEC) 40 MG capsule, Take 40 mg by mouth daily.,  Disp: , Rfl:    pseudoephedrine-dextromethorphan-guaifenesin (ROBITUSSIN-PE) 30-10-100 MG/5ML solution, Take 10 mLs by mouth 4 (four) times daily as needed for cough., Disp: , Rfl:    rosuvastatin (CRESTOR) 20 MG tablet, Take 20 mg by mouth at bedtime., Disp: , Rfl:    Spacer/Aero-Holding Chambers (AEROCHAMBER MV) inhaler, Use as instructed, Disp: 1 each, Rfl: 0   SYMBICORT 160-4.5 MCG/ACT inhaler, Inhale 2 puffs into the lungs in the morning and at bedtime., Disp: 10.2 g, Rfl: 0   tiotropium (SPIRIVA) 18 MCG inhalation capsule, Place 18 mcg into inhaler and inhale daily., Disp: , Rfl:    traZODone (DESYREL) 50 MG tablet, Take 50 mg by mouth at bedtime. (Patient not taking: Reported on 11/25/2022), Disp: , Rfl:    Vitamin D, Ergocalciferol, (DRISDOL) 1.25 MG (50000 UNIT) CAPS capsule, Take 50,000 Units by mouth once a week., Disp: , Rfl:    Physical exam: There were no vitals filed for this visit. Physical Exam Cardiovascular:     Rate and Rhythm: Normal rate and regular rhythm.     Heart sounds: Normal heart sounds.  Pulmonary:     Effort: Pulmonary effort is normal.     Breath sounds: Normal breath sounds.  Abdominal:     General: Bowel sounds are normal.     Palpations: Abdomen is soft.     Comments: No palpable hepatosplenomegaly  Lymphadenopathy:     Comments: No palpable cervical, supraclavicular, axillary or inguinal adenopathy    Skin:    General: Skin is warm and dry.  Neurological:     Mental Status: She is alert and oriented to person, place, and time.           Latest Ref Rng & Units 11/25/2022    2:30 PM  CMP  Creatinine 0.44 - 1.00 mg/dL 1.61       Latest Ref Rng & Units 11/26/2022    6:30 AM  CBC  WBC 4.0 - 10.5 K/uL 16.5   Hemoglobin 12.0 - 15.0 g/dL 09.6   Hematocrit 04.5 - 46.0 % 40.8   Platelets 150 - 400 K/uL 451     No images are attached to the encounter.  DG Chest 2 View  Result  Date: 11/25/2022 CLINICAL DATA:  Shortness of breath. EXAM:  CHEST - 2 VIEW COMPARISON:  May 16, 2022. FINDINGS: The heart size and mediastinal contours are within normal limits. Both lungs are clear. The visualized skeletal structures are unremarkable. IMPRESSION: No active cardiopulmonary disease. Electronically Signed   By: Lupita Raider M.D.   On: 11/25/2022 10:20    Assessment and plan- Patient is a 72 y.o. female referred for leukocytosis/neutrophilia  Suspect leukocytosis/neutrophilia reactive secondary to underlying COPD.  I will check CBC with differential, peripheral flow cytometry, JAK2, CALR, MPL BCR-ABL FISH testing given both leukocytosis and thrombocytosis.  In person or virtual visit with me in 2 to 3 weeks time   Thank you for this kind referral and the opportunity to participate in the care of this patient   Visit Diagnosis 1. Neutrophilia   2. Thrombocytosis     Dr. Owens Shark, MD, MPH [facility] at [facility] [phone] 12/09/2022

## 2022-12-11 LAB — COMP PANEL: LEUKEMIA/LYMPHOMA

## 2022-12-13 LAB — JAK2 V617F RFX CALR/MPL/E12-15

## 2022-12-13 LAB — BCR-ABL1 FISH
Cells Analyzed: 200
Cells Counted: 200

## 2022-12-13 LAB — CALR +MPL + E12-E15  (REFLEX)

## 2023-01-01 ENCOUNTER — Encounter: Payer: Self-pay | Admitting: Oncology

## 2023-01-01 ENCOUNTER — Inpatient Hospital Stay: Payer: 59 | Attending: Oncology | Admitting: Oncology

## 2023-01-01 VITALS — BP 152/77 | HR 92 | Temp 99.0°F | Wt 195.0 lb

## 2023-01-01 DIAGNOSIS — D75839 Thrombocytosis, unspecified: Secondary | ICD-10-CM

## 2023-01-01 DIAGNOSIS — Z79899 Other long term (current) drug therapy: Secondary | ICD-10-CM | POA: Insufficient documentation

## 2023-01-01 DIAGNOSIS — Z7982 Long term (current) use of aspirin: Secondary | ICD-10-CM | POA: Diagnosis not present

## 2023-01-01 DIAGNOSIS — D72828 Other elevated white blood cell count: Secondary | ICD-10-CM | POA: Diagnosis not present

## 2023-01-01 DIAGNOSIS — D72829 Elevated white blood cell count, unspecified: Secondary | ICD-10-CM | POA: Insufficient documentation

## 2023-01-01 DIAGNOSIS — F17210 Nicotine dependence, cigarettes, uncomplicated: Secondary | ICD-10-CM | POA: Diagnosis not present

## 2023-01-01 MED ORDER — PREDNISONE 50 MG PO TABS
100.0000 mg | ORAL_TABLET | Freq: Every day | ORAL | 0 refills | Status: AC
Start: 2023-01-01 — End: 2023-01-06

## 2023-01-01 NOTE — Progress Notes (Signed)
Hematology/Oncology Consult note [facility]  Telephone:([phone] [phone]  Patient Care Team: Alm Bustard, NP as PCP - General (Family Medicine) Creig Hines, MD as Consulting Physician (Oncology)   Name of the patient: Deborah Montgomery  [id number]  Jun 05, 1950   Date of visit: 01/01/23  Diagnosis-neutrophilia and thrombocytosis likely reactive  Chief complaint/ Reason for visit-discuss results of blood work  Heme/Onc history: patient is a 72 year old female with a past medical history significant for hypertension hyperlipidemia anxiety COPD among other medical problems .  She has been referred to [place] for leukocytosis.  White cell count on 12/03/2022 was 17.9 with an H&H of 13.8/41.5 and a platelet count of 477.  Looking back at her prior CBCs her white count has been fluctuating between 12-17 over the last 1 year and differential mainly shows neutrophilia.  She has had chronic leukocytosis/neutrophilia at least dating back to 2017 without a clear rising trend.  Platelet counts have been in the 400s since April 2024  Result of blood work from 12/09/2022 were as follows: CBC showed white count of 18.4 with an ANC of 14, H&H of 13.8/42.6 with a platelet count of 454.  Low cytometry showed no immunophenotypic abnormalities.  BCR-ABL FISH testing negative.  JAK2, CALR, MPL testing negative.    Interval history-patient takes steroids on and off for asthma.  She was also started on antibiotics recently for URI  ECOG PS- 1 Pain scale- 0   Review of systems- Review of Systems  Constitutional:  Negative for chills, fever, malaise/fatigue and weight loss.  HENT:  Negative for congestion, ear discharge and nosebleeds.   Eyes:  Negative for blurred vision.  Respiratory:  Negative for cough, hemoptysis, sputum production, shortness of breath and wheezing.   Cardiovascular:  Negative for chest pain, palpitations, orthopnea and claudication.   Gastrointestinal:  Negative for abdominal pain, blood in stool, constipation, diarrhea, heartburn, melena, nausea and vomiting.  Genitourinary:  Negative for dysuria, flank pain, frequency, hematuria and urgency.  Musculoskeletal:  Negative for back pain, joint pain and myalgias.  Skin:  Negative for rash.  Neurological:  Negative for dizziness, tingling, focal weakness, seizures, weakness and headaches.  Endo/Heme/Allergies:  Does not bruise/bleed easily.  Psychiatric/Behavioral:  Negative for depression and suicidal ideas. The patient does not have insomnia.       No Known Allergies   Past Medical History:  Diagnosis Date   Anxiety    Arthritis    Asthma    COPD (chronic obstructive pulmonary disease) (HCC)    a. diagnosed 05/2015   Essential hypertension    Eye problem    GERD (gastroesophageal reflux disease)    HLD (hyperlipidemia)    a. intolerant to simvastatin    Tobacco abuse      Past Surgical History:  Procedure Laterality Date   ABDOMINAL HYSTERECTOMY     CARDIAC CATHETERIZATION N/A 06/30/2015   Procedure: Left Heart Cath and Coronary Angiography;  Surgeon: Iran Ouch, MD;  Location: [facility] INVASIVE CV LAB;  Service: Cardiovascular;  Laterality: N/A;   CARPAL TUNNEL RELEASE      Social History   Socioeconomic History   Marital status: Widowed    Spouse name: Not on file   Number of children: Not on file   Years of education: Not on file   Highest education level: Not on file  Occupational History   Not on file  Tobacco Use   Smoking status: Some Days    Types: Cigarettes  Smokeless tobacco: Not on file  Vaping Use   Vaping status: Never Used  Substance and Sexual Activity   Alcohol use: No   Drug use: No   Sexual activity: Not Currently  Other Topics Concern   Not on file  Social History Narrative   Not on file   Social Determinants of Health   Financial Resource Strain: Low Risk  (12/24/2022)   Received [facility]   Overall Financial Resource Strain (CARDIA)    Difficulty of Paying Living Expenses: Not hard at all  Food Insecurity: No Food Insecurity (12/24/2022)   Received from [facility]   Hunger Vital Sign    Worried About Running Out of Food in the Last Year: Never true    Ran Out of Food in the Last Year: Never true  Transportation Needs: No Transportation Needs (12/24/2022)   Received from [facility] - Transportation    In the past 12 months, has lack of transportation kept you from medical appointments or from getting medications?: No    Lack of Transportation (Non-Medical): No  Physical Activity: Sufficiently Active (08/09/2020)   Received from [facility], [facility]   Exercise Vital Sign    Days of Exercise per Week: 4 days    Minutes of Exercise per Session: 40 min  Stress: No Stress Concern Present (08/09/2020)   Received from [facility], [facility] of Occupational Health - Occupational Stress Questionnaire    Feeling of Stress : Not at all  Social Connections: Moderately Integrated (08/09/2020)   Received from [facility], [facility]   Social Connection and Isolation Panel [NHANES]    Frequency of Communication with Friends and Family: More than three times a week    Frequency of Social Gatherings with Friends and Family: More than three times a week    Attends Religious Services: 1 to 4 times per year    Active Member of [employer] or Organizations: Yes    Attends Banker Meetings: 1 to 4 times per year    Marital Status: Widowed  Intimate Partner Violence: Not At Risk (12/09/2022)   Humiliation, Afraid, Rape, and Kick questionnaire    Fear of Current or Ex-Partner: No    Emotionally Abused: No    Physically Abused: No    Sexually Abused: No    Family History  Problem Relation Age of Onset   Lung cancer Brother    Breast cancer Paternal Aunt    Breast cancer  Paternal Aunt      Current Outpatient Medications:    albuterol (PROVENTIL) (2.5 MG/3ML) 0.083% nebulizer solution, Take 3 mLs (2.5 mg total) by nebulization every 4 (four) hours as needed for wheezing or shortness of breath., Disp: 75 mL, Rfl: 0   albuterol (VENTOLIN HFA) 108 (90 Base) MCG/ACT inhaler, Inhale 2 puffs into the lungs every 6 (six) hours as needed for wheezing or shortness of breath., Disp: 8 g, Rfl: 2   alendronate (FOSAMAX) 70 MG tablet, Take 70 mg by mouth once a week., Disp: , Rfl:    amLODipine (NORVASC) 5 MG tablet, Take 10 mg by mouth daily., Disp: , Rfl:    ezetimibe (ZETIA) 10 MG tablet, Take 10 mg by mouth daily., Disp: , Rfl:    fluticasone (FLONASE) 50 MCG/ACT nasal spray, Place 2 sprays into both nostrils daily as needed for rhinitis., Disp: , Rfl:  ibuprofen (ADVIL) 800 MG tablet, Take 800 mg by mouth every 6 (six) hours as needed., Disp: , Rfl:    latanoprost (XALATAN) 0.005 % ophthalmic solution, Place 1 drop into both eyes at bedtime., Disp: , Rfl:    losartan (COZAAR) 50 MG tablet, Take 100 mg by mouth daily., Disp: , Rfl:    montelukast (SINGULAIR) 10 MG tablet, Take 10 mg by mouth daily., Disp: , Rfl:    Spacer/Aero-Holding Chambers (AEROCHAMBER MV) inhaler, Use as instructed, Disp: 1 each, Rfl: 0   tiotropium (SPIRIVA) 18 MCG inhalation capsule, Place 18 mcg into inhaler and inhale daily., Disp: , Rfl:    Vitamin D, Ergocalciferol, (DRISDOL) 1.25 MG (50000 UNIT) CAPS capsule, Take 50,000 Units by mouth once a week., Disp: , Rfl:    aspirin EC 81 MG EC tablet, Take 1 tablet (81 mg total) by mouth daily. (Patient not taking: Reported on 05/16/2022), Disp: 30 tablet, Rfl: 0   diclofenac sodium (VOLTAREN) 1 % GEL, Apply 2 g topically 4 (four) times daily as needed (for shoulder pain). (Patient not taking: Reported on 12/09/2022), Disp: , Rfl:    nicotine (NICODERM CQ - DOSED IN MG/24 HOURS) 14 mg/24hr patch, Place 1 patch (14 mg total) onto the skin daily.  (Patient not taking: Reported on 12/09/2022), Disp: 90 patch, Rfl: 3   nicotine polacrilex (NICOTINE MINI) 4 MG lozenge, Take 1 lozenge (4 mg total) by mouth as needed for smoking cessation. (Patient not taking: Reported on 12/09/2022), Disp: 100 tablet, Rfl: 0   omeprazole (PRILOSEC) 40 MG capsule, Take 40 mg by mouth daily. (Patient not taking: Reported on 12/09/2022), Disp: , Rfl:    predniSONE (DELTASONE) 50 MG tablet, Take 2 tablets (100 mg total) by mouth daily with breakfast for 5 days., Disp: 10 tablet, Rfl: 0   pseudoephedrine-dextromethorphan-guaifenesin (ROBITUSSIN-PE) 30-10-100 MG/5ML solution, Take 10 mLs by mouth 4 (four) times daily as needed for cough. (Patient not taking: Reported on 12/09/2022), Disp: , Rfl:    rosuvastatin (CRESTOR) 20 MG tablet, Take 20 mg by mouth at bedtime. (Patient not taking: Reported on 12/09/2022), Disp: , Rfl:    SYMBICORT 160-4.5 MCG/ACT inhaler, Inhale 2 puffs into the lungs in the morning and at bedtime. (Patient not taking: Reported on 12/09/2022), Disp: 10.2 g, Rfl: 0   traZODone (DESYREL) 50 MG tablet, Take 50 mg by mouth at bedtime. (Patient not taking: Reported on 11/25/2022), Disp: , Rfl:   Physical exam:  Vitals:   01/01/23 0916  BP: (!) 152/77  Pulse: 92  Temp: 99 F (37.2 C)  TempSrc: Tympanic  SpO2: 100%  Weight: 195 lb (88.5 kg)   Physical Exam Cardiovascular:     Rate and Rhythm: Normal rate and regular rhythm.     Heart sounds: Normal heart sounds.  Pulmonary:     Effort: Pulmonary effort is normal.     Breath sounds: Normal breath sounds.  Abdominal:     General: Bowel sounds are normal.     Palpations: Abdomen is soft.  Skin:    General: Skin is warm and dry.  Neurological:     Mental Status: She is alert and oriented to person, place, and time.         Latest Ref Rng & Units 11/25/2022    2:30 PM  CMP  Creatinine 0.44 - 1.00 mg/dL 8.29       Latest Ref Rng & Units 12/09/2022   11:30 AM  CBC  WBC 4.0 - 10.5  K/uL 18.4  Hemoglobin 12.0 - 15.0 g/dL 45.4   Hematocrit 09.8 - 46.0 % 42.6   Platelets 150 - 400 K/uL 454      Assessment and plan- Patient is a 72 y.o. female referred for neutrophilia and thrombocytosis likely reactive  Discussed the results of blood work including BCR-ABL FISH, flow cytometry, JAK2, CALR and MPL testing which were all unremarkable.  Her leukocytosis/neutrophilia and thrombocytosis are therefore likely reactive and not secondary to an underlying bone marrow disorder.  She does not require any intervention for this.  Will repeat CBC with differential in 6 months in 1 year and see her back in 1 year   Visit Diagnosis 1. Neutrophilia   2. Thrombocytosis      Dr. Owens Shark, MD, MPH [facility] [phone] 01/01/2023 12:54 PM

## 2023-06-02 ENCOUNTER — Other Ambulatory Visit: Payer: Self-pay | Admitting: Family Medicine

## 2023-06-02 DIAGNOSIS — Z1231 Encounter for screening mammogram for malignant neoplasm of breast: Secondary | ICD-10-CM

## 2023-07-02 ENCOUNTER — Inpatient Hospital Stay: Payer: 59

## 2023-07-24 ENCOUNTER — Inpatient Hospital Stay: Attending: Oncology

## 2023-07-24 DIAGNOSIS — D72828 Other elevated white blood cell count: Secondary | ICD-10-CM

## 2023-07-24 DIAGNOSIS — D75839 Thrombocytosis, unspecified: Secondary | ICD-10-CM | POA: Insufficient documentation

## 2023-07-24 LAB — CBC WITH DIFFERENTIAL (CANCER CENTER ONLY)
Abs Immature Granulocytes: 0.04 10*3/uL (ref 0.00–0.07)
Basophils Absolute: 0.1 10*3/uL (ref 0.0–0.1)
Basophils Relative: 1 %
Eosinophils Absolute: 0.3 10*3/uL (ref 0.0–0.5)
Eosinophils Relative: 2 %
HCT: 41.3 % (ref 36.0–46.0)
Hemoglobin: 13.5 g/dL (ref 12.0–15.0)
Immature Granulocytes: 0 %
Lymphocytes Relative: 22 %
Lymphs Abs: 2.7 10*3/uL (ref 0.7–4.0)
MCH: 28.1 pg (ref 26.0–34.0)
MCHC: 32.7 g/dL (ref 30.0–36.0)
MCV: 85.9 fL (ref 80.0–100.0)
Monocytes Absolute: 0.6 10*3/uL (ref 0.1–1.0)
Monocytes Relative: 5 %
Neutro Abs: 8.7 10*3/uL — ABNORMAL HIGH (ref 1.7–7.7)
Neutrophils Relative %: 70 %
Platelet Count: 445 10*3/uL — ABNORMAL HIGH (ref 150–400)
RBC: 4.81 MIL/uL (ref 3.87–5.11)
RDW: 13 % (ref 11.5–15.5)
WBC Count: 12.4 10*3/uL — ABNORMAL HIGH (ref 4.0–10.5)
nRBC: 0 % (ref 0.0–0.2)

## 2023-08-11 ENCOUNTER — Other Ambulatory Visit: Payer: Self-pay

## 2023-08-11 ENCOUNTER — Emergency Department: Admission: EM | Admit: 2023-08-11 | Discharge: 2023-08-11 | Disposition: A | Discharge status: Home / Self Care

## 2023-08-11 ENCOUNTER — Emergency Department

## 2023-08-11 DIAGNOSIS — I10 Essential (primary) hypertension: Secondary | ICD-10-CM | POA: Diagnosis not present

## 2023-08-11 DIAGNOSIS — R0602 Shortness of breath: Secondary | ICD-10-CM

## 2023-08-11 DIAGNOSIS — J441 Chronic obstructive pulmonary disease with (acute) exacerbation: Secondary | ICD-10-CM | POA: Insufficient documentation

## 2023-08-11 DIAGNOSIS — D72829 Elevated white blood cell count, unspecified: Secondary | ICD-10-CM | POA: Diagnosis not present

## 2023-08-11 LAB — BLOOD GAS, VENOUS
Acid-Base Excess: 0.1 mmol/L (ref 0.0–2.0)
Bicarbonate: 27.6 mmol/L (ref 20.0–28.0)
O2 Saturation: 49.6 %
Patient temperature: 37
pCO2, Ven: 56 mmHg (ref 44–60)
pH, Ven: 7.3 (ref 7.25–7.43)
pO2, Ven: 34 mmHg (ref 32–45)

## 2023-08-11 LAB — CBC WITH DIFFERENTIAL/PLATELET
Abs Immature Granulocytes: 0.09 K/uL — ABNORMAL HIGH (ref 0.00–0.07)
Basophils Absolute: 0.1 K/uL (ref 0.0–0.1)
Basophils Relative: 0 %
Eosinophils Absolute: 0.6 K/uL — ABNORMAL HIGH (ref 0.0–0.5)
Eosinophils Relative: 3 %
HCT: 41.9 % (ref 36.0–46.0)
Hemoglobin: 13.6 g/dL (ref 12.0–15.0)
Immature Granulocytes: 1 %
Lymphocytes Relative: 26 %
Lymphs Abs: 4.8 K/uL — ABNORMAL HIGH (ref 0.7–4.0)
MCH: 28.3 pg (ref 26.0–34.0)
MCHC: 32.5 g/dL (ref 30.0–36.0)
MCV: 87.1 fL (ref 80.0–100.0)
Monocytes Absolute: 0.9 K/uL (ref 0.1–1.0)
Monocytes Relative: 5 %
Neutro Abs: 12.2 K/uL — ABNORMAL HIGH (ref 1.7–7.7)
Neutrophils Relative %: 65 %
Platelets: 453 K/uL — ABNORMAL HIGH (ref 150–400)
RBC: 4.81 MIL/uL (ref 3.87–5.11)
RDW: 13.3 % (ref 11.5–15.5)
WBC: 18.5 K/uL — ABNORMAL HIGH (ref 4.0–10.5)
nRBC: 0 % (ref 0.0–0.2)

## 2023-08-11 LAB — RESP PANEL BY RT-PCR (RSV, FLU A&B, COVID)  RVPGX2
Influenza A by PCR: NEGATIVE
Influenza B by PCR: NEGATIVE
Resp Syncytial Virus by PCR: NEGATIVE
SARS Coronavirus 2 by RT PCR: NEGATIVE

## 2023-08-11 LAB — COMPREHENSIVE METABOLIC PANEL WITH GFR
ALT: 16 U/L (ref 0–44)
AST: 23 U/L (ref 15–41)
Albumin: 3.9 g/dL (ref 3.5–5.0)
Alkaline Phosphatase: 94 U/L (ref 38–126)
Anion gap: 14 (ref 5–15)
BUN: 11 mg/dL (ref 8–23)
CO2: 26 mmol/L (ref 22–32)
Calcium: 9.6 mg/dL (ref 8.9–10.3)
Chloride: 102 mmol/L (ref 98–111)
Creatinine, Ser: 0.87 mg/dL (ref 0.44–1.00)
GFR, Estimated: >60 mL/min (ref >60)
Glucose, Bld: 150 mg/dL — ABNORMAL HIGH (ref 70–99)
Potassium: 3.2 mmol/L — ABNORMAL LOW (ref 3.5–5.1)
Sodium: 142 mmol/L (ref 135–145)
Total Bilirubin: 0.5 mg/dL (ref 0.0–1.2)
Total Protein: 7.3 g/dL (ref 6.5–8.1)

## 2023-08-11 LAB — BRAIN NATRIURETIC PEPTIDE: B Natriuretic Peptide: 38.3 pg/mL (ref 0.0–100.0)

## 2023-08-11 MED ORDER — PREDNISONE 20 MG PO TABS: 20.0000 mg | ORAL_TABLET | Freq: Two times a day (BID) | ORAL | 0 refills | Status: AC

## 2023-08-11 NOTE — ED Notes (Signed)
 Pt ambulated in hallway with steady gait. O2 96-98% on room air.

## 2023-08-11 NOTE — ED Provider Notes (Signed)
 [facility] Provider Note    None    (approximate)   History   Shortness of Breath   HPI  Deborah Montgomery is a 72 y.o. female with past medical history of COPD, hypertension, anxiety who presents with an episode of transient shortness of breath.  Patient reports that for the past 3 days she has had worsening shortness of breath and cough.  She thinks it may be related to overexerting herself outside in the heat.  She was home with her son and EMS was called for shortness of breath.  Upon arrival she was apparently hypoxic to the 50s and reportedly confused.  She received 3 DuoNeb's by EMS and 125 mg of Solu-Medrol .  She apparently regained appropriate mentation en route.  Here she denies any complaints including shortness of breath and is satting 98% on room air.  She denies any chest pain abdominal pain changes in urinary or bowel habits and states that she feels much better.  She has not had any fevers at home      Physical Exam   Triage Vital Signs: ED Triage Vitals [08/11/23 2113]  Encounter Vitals Group     BP      Girls Systolic BP Percentile      Girls Diastolic BP Percentile      Boys Systolic BP Percentile      Boys Diastolic BP Percentile      Pulse      Resp      Temp      Temp src      SpO2 (!) 55 %     Weight      Height      Head Circumference      Peak Flow      Pain Score      Pain Loc      Pain Education      Exclude from Growth Chart     Most recent vital signs: Vitals:   08/11/23 2300 08/11/23 2320  BP: (!) 161/81   Pulse: (!) 105 (!) 101  Resp: (!) 21 19  Temp:    SpO2: 99% 100%    Nursing Triage Note reviewed. Vital signs reviewed and patients oxygen saturation is normoxic at bedside  General: Patient is well nourished, well developed, awake and alert, resting comfortably in no acute distress Head: Normocephalic and atraumatic Eyes: Normal inspection, extraocular muscles intact, no conjunctival pallor Ear,  nose, throat: Normal external exam Neck: Normal range of motion Respiratory: Patient is in no respiratory distress, lungs CTAB currently Cardiovascular: Patient is not tachycardic, RRR without murmur appreciated GI: Abd SNT with no guarding or rebound  Back: Normal inspection of the back with good strength and range of motion throughout all ext Extremities: pulses intact with good cap refills, no LE pitting edema or calf tenderness Neuro: The patient is alert and oriented to person, place, and time, appropriately conversive, with 5/5 bilat UE/LE strength, no gross motor or sensory defects noted. Coordination appears to be adequate. Skin: Warm, dry, and intact Psych: normal mood and affect, no SI or HI  ED Results / Procedures / Treatments   Labs (all labs ordered are listed, but only abnormal results are displayed) Labs Reviewed  CBC WITH DIFFERENTIAL/PLATELET - Abnormal; Notable for the following components:      Result Value   WBC 18.5 (*)    Platelets 453 (*)    Neutro Abs 12.2 (*)    Lymphs Abs 4.8 (*)  Eosinophils Absolute 0.6 (*)    Abs Immature Granulocytes 0.09 (*)    All other components within normal limits  COMPREHENSIVE METABOLIC PANEL WITH GFR - Abnormal; Notable for the following components:   Potassium 3.2 (*)    Glucose, Bld 150 (*)    All other components within normal limits  RESP PANEL BY RT-PCR (RSV, FLU A&B, COVID)  RVPGX2  BLOOD GAS, VENOUS  BRAIN NATRIURETIC PEPTIDE  CBG MONITORING, ED     EKG EKG and rhythm strip are interpreted by myself:   EKG: [tachycardic sinus rhythm] at heart rate of 107, normal QRS duration, QTc 466, nonspecific ST segments and T waves no ectopy EKG not consistent with Acute STEMI Rhythm strip: Tachycardic sinus rhythm in lead II   RADIOLOGY XR chest: No acute abnormality on my independent review and interpretation and radiologist agrees    PROCEDURES:  Critical Care performed: No  Procedures   MEDICATIONS  ORDERED IN ED: Medications - No data to display   IMPRESSION / MDM / ASSESSMENT AND PLAN / ED COURSE                                Differential diagnosis includes, but is not limited to, COPD exacerbation, pneumonia, upper respiratory infection, anemia, CHF exacerbation hypercarbia  ED course: Patient arrives in despite concerning EMS report, is satting 98% on room air and no wheezing is heard on exam.  Chest x-ray without evidence of pneumonia.  BNP is not elevated at 38.3.  She had no evidence of hypercarbia blood gas and her respiratory viral panel was unremarkable.  Patient was able to ambulate around the unit without dropping her oxygen saturation.  She feels comfortable returning home   Clinical Course as of 08/12/23 0004  Mon Aug 11, 2023  2140 WBC(!): 18.5 Possibly reactive from the steroids but is significantly elevated [HD]  2140 pH, Ven: 7.3 No significant hypercarbia [HD]  2251 DG Chest 2 View No acute abnormality on my independent review and interpretation [HD]  2251 SpO2: 98 % Remains on room air [HD]  2251 ECG Heart Rate: 99 Less tachycardic as albuterol  if system [HD]  2307 Patient ambulated around the unit and denied any shortness of breath and did not drop her oxygen below 96%.  She feels comfortable returning home at this point.  I we will prescribe her 5 days of prednisone .  She will use around-the-clock nebulizing treatments.  And will follow-up with her pulmonologist team [HD]    Clinical Course User Index [HD] Nicholaus Rolland BRAVO, MD   At time of discharge there is no evidence of acute life, limb, vision, or fertility threat. Patient has stable vital signs, pain is well controlled, patient is ambulatory and p.o. tolerant.  Discharge instructions were completed using the [employer] system. I would refer you to those at this time. All warnings prescriptions follow-up etc. were discussed in detail with the patient. Patient indicates understanding and is agreeable with  this plan. All questions answered.  Patient is made aware that they may return to the emergency department for any worsening or new condition or for any other emergency. -- Risk: 5 This patient has a high risk of morbidity due to further diagnostic testing or treatment. Rationale: This patient's evaluation and management involve a high risk of morbidity due to the potential severity of presenting symptoms, need for diagnostic testing, and/or initiation of treatment that may require close monitoring. The differential  includes conditions with potential for significant deterioration or requiring escalation of care. Treatment decisions in the ED, including medication administration, procedural interventions, or disposition planning, reflect this level of risk. Additional Support: -- Drug therapy requiring intensive monitoring for toxicity [ ]  -- Decision regarding elective major surgery with idenitified patient or procedure risk factors [ ]  -- Decision regarding hospitalization or escalation of hospital-level care [ ]  -- Decision not to resuscitate or to de-escalate care because of poor prognosis [ ]  -- Parental controlled substances [ ]   COPA: 5 The patient has a severe exacerbation, progression, or side effect of treatment of the following illness/illnesses: []  OR  The patient has the following acute or chronic illness/injury that poses a possible threat to life or bodily function: [X] : The patient has a potentially serious acute condition or an acute exacerbation of a chronic illness requiring urgent evaluation and management in the Emergency Department. The clinical presentation necessitates immediate consideration of life-threatening or function-threatening diagnoses, even if they are ultimately ruled out.  Data(2/3 categories following were performed): 5 I reviewed or ordered at least three unique tests, external notes, and/or the history required an independent historian as one of the three  requirements as following: CBC, CMP, BNP, VBG AND  I independently interpreted the following test: XR chest OR  I discussed the management of the patient with the following external physician or qualified healthcare provider: []     Suggested E/M Coding Level: 5, 99285, This has been selected based on the September 15, 2021 CPT guidelines for E/M codes in the Emergency Department based on 2/3 of the CoPA, Data, and Risk.    FINAL CLINICAL IMPRESSION(S) / ED DIAGNOSES   Final diagnoses:  COPD exacerbation (HCC)  Shortness of breath     Rx / DC Orders   ED Discharge Orders          Ordered    predniSONE  (DELTASONE ) 20 MG tablet  2 times daily with meals        08/11/23 September 16, 2310             Note:  This document was prepared using Dragon voice recognition software and may include unintentional dictation errors.   Nicholaus Rolland BRAVO, MD 08/12/23 [phone]

## 2023-08-11 NOTE — ED Triage Notes (Signed)
 Pt presented to ED via EMS from home with c/o sudden onset of shortness of breath and semi conscious. Per EMS, fire was first to scene and pt O2 55% on RA, was given home albuterol  treatment. On EMS arrival, pt O2 78%. Pt was given 2 duonebs by EMS. On arrival to ED, pt 98% RA. EMS also states pt initial blood pressure 250/110, on arrival to ED 134/75 (85). Pt received 125 solumedrol via EMS. Pt states she feels much better.

## 2023-08-11 NOTE — Discharge Instructions (Signed)
 For the next 48 hours, please make sure you take a nebulizing treatment every 4-6 hours.  Your next dose of steroids will be due tomorrow morning.  Please return with any acutely worsening symptoms or any other emergency.  It is very nice meeting you and I wish you the best of luck with everything. Rolland Moats, MD, PhD -- RETURN PRECAUTIONS & AFTERCARE: (ENGLISH) RETURN PRECAUTIONS: Return immediately to the emergency department or see/call your doctor if you feel worse, weak or have changes in speech or vision, are short of breath, have fever, vomiting, pain, bleeding or dark stool, trouble urinating or any new issues. Return here or see/call your doctor if not improving as expected for your suspected condition. FOLLOW-UP CARE: Call your doctor and/or any doctors we referred you to for more advice and to make an appointment. Do this today, tomorrow or after the weekend. Some doctors only take PPO insurance so if you have HMO insurance you may want to contact your HMO or your regular doctor for referral to a specialist within your plan. Either way tell the doctor's office that it was a referral from the emergency department so you get the soonest possible appointment.  YOUR TEST RESULTS: Take result reports of any blood or urine tests, imaging tests and EKG's to your doctor and any referral doctor. Have any abnormal tests repeated. Your doctor or a referral doctor can let you know when this should be done. Also make sure your doctor contacts this hospital to get any test results that are not currently available such as cultures or special tests for infection and final imaging reports, which are often not available at the time you leave the ER but which may list additional important findings that are not documented on the preliminary report. BLOOD PRESSURE: If your blood pressure was greater than 120/80 have your blood pressure rechecked within 1 to 2 weeks. MEDICATION SIDE EFFECTS: Do not drive, walk, bike,  take the bus, etc. if you have received or are being prescribed any sedating medications such as those for pain or anxiety or certain antihistamines like Benadryl. If you have been give one of these here get a taxi home or have a friend drive you home. Ask your pharmacist to counsel you on potential side effects of any new medication

## 2023-08-12 NOTE — ED Provider Notes (Incomplete)
 [facility] Provider Note    None    (approximate)   History   Shortness of Breath   HPI  Deborah Montgomery is a 73 y.o. female with past medical history of COPD, hypertension, anxiety who presents with an episode of transient shortness of breath.  Patient reports that for the past 3 days she has had worsening shortness of breath and cough.  She thinks it may be related to overexerting herself outside in the heat.  She was home with her son and EMS was called for shortness of breath.  Upon arrival she was apparently hypoxic to the 51s and reportedly confused.  She received 3 DuoNeb's by EMS and 125 mg of Solu-Medrol .  She apparently regained appropriate mentation en route.  Here she denies any complaints including shortness of breath and is satting 98% on room air.  She denies any chest pain abdominal pain changes in urinary or bowel habits and states that she feels much better.  She has not had any fevers at home      Physical Exam   Triage Vital Signs: ED Triage Vitals [08/11/23 2113]  Encounter Vitals Group     BP      Girls Systolic BP Percentile      Girls Diastolic BP Percentile      Boys Systolic BP Percentile      Boys Diastolic BP Percentile      Pulse      Resp      Temp      Temp src      SpO2 (!) 55 %     Weight      Height      Head Circumference      Peak Flow      Pain Score      Pain Loc      Pain Education      Exclude from Growth Chart     Most recent vital signs: Vitals:   08/11/23 2113 08/11/23 2119  BP:  134/75  Pulse:  (!) 112  Resp:  (!) 24  Temp:  98.4 F (36.9 C)  SpO2: (!) 55% 98%    Nursing Triage Note reviewed. Vital signs reviewed and patients oxygen saturation is normoxic at bedside  General: Patient is well nourished, well developed, awake and alert, resting comfortably in no acute distress Head: Normocephalic and atraumatic Eyes: Normal inspection, extraocular muscles intact, no conjunctival  pallor Ear, nose, throat: Normal external exam Neck: Normal range of motion Respiratory: Patient is in no respiratory distress, lungs CTAB currently Cardiovascular: Patient is not tachycardic, RRR without murmur appreciated GI: Abd SNT with no guarding or rebound  Back: Normal inspection of the back with good strength and range of motion throughout all ext Extremities: pulses intact with good cap refills, no LE pitting edema or calf tenderness Neuro: The patient is alert and oriented to person, place, and time, appropriately conversive, with 5/5 bilat UE/LE strength, no gross motor or sensory defects noted. Coordination appears to be adequate. Skin: Warm, dry, and intact Psych: normal mood and affect, no SI or HI  ED Results / Procedures / Treatments   Labs (all labs ordered are listed, but only abnormal results are displayed) Labs Reviewed  RESP PANEL BY RT-PCR (RSV, FLU A&B, COVID)  RVPGX2  BLOOD GAS, VENOUS  CBC WITH DIFFERENTIAL/PLATELET  COMPREHENSIVE METABOLIC PANEL WITH GFR  BRAIN NATRIURETIC PEPTIDE  CBG MONITORING, ED     EKG EKG and rhythm strip  are interpreted by myself:   EKG: [tachycardic sinus rhythm] at heart rate of 107, normal QRS duration, QTc 466, nonspecific ST segments and T waves no ectopy EKG not consistent with Acute STEMI Rhythm strip: Tachycardic sinus rhythm in lead II   RADIOLOGY XR chest:     PROCEDURES:  Critical Care performed: No  Procedures   MEDICATIONS ORDERED IN ED: Medications - No data to display   IMPRESSION / MDM / ASSESSMENT AND PLAN / ED COURSE                                Differential diagnosis includes, but is not limited to, ***    ***   Clinical Course as of 08/12/23 0001  Mon Aug 11, 2023  2140 WBC(!): 18.5 Possibly reactive from the steroids but is significantly elevated [HD]  2140 pH, Ven: 7.3 No significant hypercarbia [HD]  2251 DG Chest 2 View No acute abnormality on my independent review and  interpretation [HD]  2251 SpO2: 98 % Remains on room air [HD]  2251 ECG Heart Rate: 99 Less tachycardic as albuterol  if system [HD]  2307 Patient ambulated around the unit and denied any shortness of breath and did not drop her oxygen below 96%.  She feels comfortable returning home at this point.  I we will prescribe her 5 days of prednisone .  She will use around-the-clock nebulizing treatments.  And will follow-up with her pulmonologist team [HD]    Clinical Course User Index [HD] Nicholaus Rolland BRAVO, MD     FINAL CLINICAL IMPRESSION(S) / ED DIAGNOSES   Final diagnoses:  COPD exacerbation (HCC)  Shortness of breath     Rx / DC Orders   ED Discharge Orders     None        Note:  This document was prepared using Dragon voice recognition software and may include unintentional dictation errors.

## 2023-08-23 ENCOUNTER — Encounter: Payer: Self-pay | Admitting: Emergency Medicine

## 2023-08-23 ENCOUNTER — Other Ambulatory Visit: Payer: Self-pay

## 2023-08-23 ENCOUNTER — Emergency Department

## 2023-08-23 ENCOUNTER — Observation Stay
Admission: EM | Admit: 2023-08-23 | Discharge: 2023-08-25 | Disposition: A | Discharge status: Home / Self Care | Attending: Internal Medicine | Admitting: Internal Medicine

## 2023-08-23 DIAGNOSIS — E041 Nontoxic single thyroid nodule: Secondary | ICD-10-CM | POA: Insufficient documentation

## 2023-08-23 DIAGNOSIS — Z1152 Encounter for screening for COVID-19: Secondary | ICD-10-CM | POA: Insufficient documentation

## 2023-08-23 DIAGNOSIS — R000 Tachycardia, unspecified: Secondary | ICD-10-CM | POA: Diagnosis not present

## 2023-08-23 DIAGNOSIS — I10 Essential (primary) hypertension: Secondary | ICD-10-CM | POA: Diagnosis present

## 2023-08-23 DIAGNOSIS — J441 Chronic obstructive pulmonary disease with (acute) exacerbation: Principal | ICD-10-CM | POA: Insufficient documentation

## 2023-08-23 DIAGNOSIS — Z7982 Long term (current) use of aspirin: Secondary | ICD-10-CM | POA: Diagnosis not present

## 2023-08-23 DIAGNOSIS — R918 Other nonspecific abnormal finding of lung field: Secondary | ICD-10-CM | POA: Diagnosis not present

## 2023-08-23 DIAGNOSIS — R0602 Shortness of breath: Secondary | ICD-10-CM | POA: Diagnosis present

## 2023-08-23 DIAGNOSIS — J209 Acute bronchitis, unspecified: Principal | ICD-10-CM | POA: Diagnosis present

## 2023-08-23 LAB — CBC
HCT: 41.3 % (ref 36.0–46.0)
Hemoglobin: 13.1 g/dL (ref 12.0–15.0)
MCH: 28.1 pg (ref 26.0–34.0)
MCHC: 31.7 g/dL (ref 30.0–36.0)
MCV: 88.4 fL (ref 80.0–100.0)
Platelets: 430 K/uL — ABNORMAL HIGH (ref 150–400)
RBC: 4.67 MIL/uL (ref 3.87–5.11)
RDW: 13.7 % (ref 11.5–15.5)
WBC: 20.8 K/uL — ABNORMAL HIGH (ref 4.0–10.5)
nRBC: 0 % (ref 0.0–0.2)

## 2023-08-23 LAB — BASIC METABOLIC PANEL WITH GFR
Anion gap: 14 (ref 5–15)
BUN: 17 mg/dL (ref 8–23)
CO2: 24 mmol/L (ref 22–32)
Calcium: 9.1 mg/dL (ref 8.9–10.3)
Chloride: 104 mmol/L (ref 98–111)
Creatinine, Ser: 0.72 mg/dL (ref 0.44–1.00)
GFR, Estimated: >60 mL/min (ref >60)
Glucose, Bld: 153 mg/dL — ABNORMAL HIGH (ref 70–99)
Potassium: 4.5 mmol/L (ref 3.5–5.1)
Sodium: 142 mmol/L (ref 135–145)

## 2023-08-23 MED ORDER — IPRATROPIUM-ALBUTEROL 0.5-2.5 (3) MG/3ML IN SOLN
3.0000 mL | Freq: Once | RESPIRATORY_TRACT | Status: AC
  Administered 2023-08-23: 3 mL via RESPIRATORY_TRACT
  Filled 2023-08-23: qty 3

## 2023-08-23 MED ORDER — IOHEXOL 350 MG/ML SOLN
75.0000 mL | Freq: Once | INTRAVENOUS | Status: AC | PRN
  Administered 2023-08-23: 75 mL via INTRAVENOUS

## 2023-08-23 MED ORDER — PREDNISONE 20 MG PO TABS
40.0000 mg | ORAL_TABLET | Freq: Once | ORAL | Status: AC
  Administered 2023-08-24: 40 mg via ORAL
  Filled 2023-08-23: qty 2

## 2023-08-23 NOTE — ED Provider Notes (Signed)
 [facility] Provider Note    Event Date/Time   First MD Initiated Contact with Patient 08/23/23 2313     (approximate)   History   Shortness of Breath   HPI {Remember to add pertinent medical, surgical, social, and/or OB history to HPI:1} Deborah Montgomery is a 73 y.o. female history of COPD, NSTEMI   Reviewed previous evaluation by Dr. Nicholaus.  Had clear chest x-ray on July 21  Physical Exam   Triage Vital Signs: ED Triage Vitals  Encounter Vitals Group     BP 08/23/23 2220 137/75     Girls Systolic BP Percentile --      Girls Diastolic BP Percentile --      Boys Systolic BP Percentile --      Boys Diastolic BP Percentile --      Pulse Rate 08/23/23 2220 (!) 109     Resp 08/23/23 2220 17     Temp 08/23/23 2220 98.9 F (37.2 C)     Temp Source 08/23/23 2220 Oral     SpO2 08/23/23 2220 98 %     Weight 08/23/23 2220 195 lb 12.3 oz (88.8 kg)     Height 08/23/23 2220 5' 4 (1.626 m)     Head Circumference --      Peak Flow --      Pain Score 08/23/23 2219 8     Pain Loc --      Pain Education --      Exclude from Growth Chart --     Most recent vital signs: Vitals:   08/23/23 2220 08/23/23 2230  BP: 137/75 138/67  Pulse: (!) 109 (!) 107  Resp: 17 16  Temp: 98.9 F (37.2 C)   SpO2: 98% 98%    {Only need to document appropriate and relevant physical exam:1} General: Awake, no distress. *** CV:  Good peripheral perfusion. *** Resp:  Normal effort. *** Abd:  No distention. *** Other:  ***   ED Results / Procedures / Treatments   Labs (all labs ordered are listed, but only abnormal results are displayed) Labs Reviewed  BASIC METABOLIC PANEL WITH GFR - Abnormal; Notable for the following components:      Result Value   Glucose, Bld 153 (*)    All other components within normal limits  CBC - Abnormal; Notable for the following components:   WBC 20.8 (*)    Platelets 430 (*)    All other components within normal limits   Labs  notable for leukocytosis 21,000.  Chemistry panel otherwise normal.  EKG  Interpreted by me at 2230 heart rate 105 QRS 70 QTc 530 Sinus tachycardia, no evidence of acute ischemia   RADIOLOGY Chest x-ray interpreted by me as subtle bibasilar infiltrative pattern  ***  {USE THE WORD INTERPRETED!! You MUST document your own interpretation of imaging, as well as the fact that you reviewed the radiologist's report!:1}   PROCEDURES:  Critical Care performed: {CriticalCareYesNo:19197::Yes, see critical care procedure note(s),No}  Procedures   MEDICATIONS ORDERED IN ED: Medications - No data to display   IMPRESSION / MDM / ASSESSMENT AND PLAN / ED COURSE  I reviewed the triage vital signs and the nursing notes.                              Differential diagnosis includes, but is not limited to, ***  Patient's presentation is most consistent with {EM COPA:27473}  *** {If  the patient is on the monitor, remove the brackets and asterisks on the sentence below and remember to document it as a Procedure as well. Otherwise delete the sentence below:1} {**The patient is on the cardiac monitor to evaluate for evidence of arrhythmia and/or significant heart rate changes.**} {Remember to include, when applicable, any/all of the following data: independent review of imaging independent review of labs (comment specifically on pertinent positives and negatives) review of specific prior hospitalizations, PCP/specialist notes, etc. discuss meds given and prescribed document any discussion with consultants (including hospitalists) any clinical decision tools you used and why (PECARN, NEXUS, etc.) did you consider admitting the patient? document social determinants of health affecting patient's care (homelessness, inability to follow up in a timely fashion, etc) document any pre-existing conditions increasing risk on current visit (e.g. diabetes and HTN increasing danger of high-risk chest  pain/ACS) describes what meds you gave (especially parenteral) and why any other interventions?:1}     FINAL CLINICAL IMPRESSION(S) / ED DIAGNOSES   Final diagnoses:  None     Rx / DC Orders   ED Discharge Orders     None        Note:  This document was prepared using Dragon voice recognition software and may include unintentional dictation errors.

## 2023-08-23 NOTE — ED Triage Notes (Signed)
 To ER from home via EMS for report of respiratory distress, has COPD. Similar visit the other day. Received 2 duonebs, 1 albuterol  and 125mg  of solumedrol. Patient reports breathing is much improved.

## 2023-08-24 DIAGNOSIS — J440 Chronic obstructive pulmonary disease with acute lower respiratory infection: Secondary | ICD-10-CM

## 2023-08-24 DIAGNOSIS — E041 Nontoxic single thyroid nodule: Secondary | ICD-10-CM | POA: Insufficient documentation

## 2023-08-24 DIAGNOSIS — J209 Acute bronchitis, unspecified: Secondary | ICD-10-CM | POA: Diagnosis not present

## 2023-08-24 LAB — LACTIC ACID, PLASMA
Lactic Acid, Venous: 1.8 mmol/L (ref 0.5–1.9)
Lactic Acid, Venous: 1.8 mmol/L (ref 0.5–1.9)

## 2023-08-24 LAB — PROTIME-INR
INR: 0.9 (ref 0.8–1.2)
Prothrombin Time: 13 s (ref 11.4–15.2)

## 2023-08-24 LAB — TROPONIN I (HIGH SENSITIVITY): Troponin I (High Sensitivity): 70 ng/L — ABNORMAL HIGH

## 2023-08-24 LAB — RESP PANEL BY RT-PCR (RSV, FLU A&B, COVID)  RVPGX2
Influenza A by PCR: NEGATIVE
Influenza B by PCR: NEGATIVE
Resp Syncytial Virus by PCR: NEGATIVE
SARS Coronavirus 2 by RT PCR: NEGATIVE

## 2023-08-24 MED ORDER — HYDROCODONE-ACETAMINOPHEN 5-325 MG PO TABS: 1.0000 | ORAL_TABLET | ORAL | Status: DC | PRN

## 2023-08-24 MED ORDER — ACETAMINOPHEN 650 MG RE SUPP: 650.0000 mg | Freq: Four times a day (QID) | RECTAL | Status: DC | PRN

## 2023-08-24 MED ORDER — ENOXAPARIN SODIUM 60 MG/0.6ML IJ SOSY
0.5000 mg/kg | PREFILLED_SYRINGE | INTRAMUSCULAR | Status: DC
  Administered 2023-08-24 – 2023-08-25 (×2): 45 mg via SUBCUTANEOUS
  Filled 2023-08-24 (×2): qty 0.6

## 2023-08-24 MED ORDER — ONDANSETRON HCL 4 MG/2ML IJ SOLN: 4.0000 mg | Freq: Four times a day (QID) | INTRAMUSCULAR | Status: DC | PRN

## 2023-08-24 MED ORDER — ONDANSETRON HCL 4 MG PO TABS: 4.0000 mg | ORAL_TABLET | Freq: Four times a day (QID) | ORAL | Status: DC | PRN

## 2023-08-24 MED ORDER — PREDNISONE 20 MG PO TABS
40.0000 mg | ORAL_TABLET | Freq: Every day | ORAL | Status: DC
  Administered 2023-08-25: 40 mg via ORAL
  Filled 2023-08-24: qty 2

## 2023-08-24 MED ORDER — IPRATROPIUM-ALBUTEROL 0.5-2.5 (3) MG/3ML IN SOLN
3.0000 mL | Freq: Four times a day (QID) | RESPIRATORY_TRACT | Status: DC
  Administered 2023-08-24 – 2023-08-25 (×5): 3 mL via RESPIRATORY_TRACT
  Filled 2023-08-24 (×5): qty 3

## 2023-08-24 MED ORDER — IPRATROPIUM-ALBUTEROL 0.5-2.5 (3) MG/3ML IN SOLN
3.0000 mL | Freq: Once | RESPIRATORY_TRACT | Status: AC
  Administered 2023-08-24: 3 mL via RESPIRATORY_TRACT
  Filled 2023-08-24: qty 3

## 2023-08-24 MED ORDER — ACETAMINOPHEN 325 MG PO TABS: 650.0000 mg | ORAL_TABLET | Freq: Four times a day (QID) | ORAL | Status: DC | PRN

## 2023-08-24 MED ORDER — AZITHROMYCIN 500 MG PO TABS
500.0000 mg | ORAL_TABLET | Freq: Once | ORAL | Status: AC
  Administered 2023-08-24: 500 mg via ORAL
  Filled 2023-08-24: qty 1

## 2023-08-24 MED ORDER — ALBUTEROL SULFATE (2.5 MG/3ML) 0.083% IN NEBU: 2.5000 mg | INHALATION_SOLUTION | RESPIRATORY_TRACT | Status: DC | PRN

## 2023-08-24 MED ORDER — METHYLPREDNISOLONE SODIUM SUCC 40 MG IJ SOLR
40.0000 mg | Freq: Two times a day (BID) | INTRAMUSCULAR | Status: AC
  Administered 2023-08-24 – 2023-08-25 (×2): 40 mg via INTRAVENOUS
  Filled 2023-08-24 (×2): qty 1

## 2023-08-24 MED ORDER — GUAIFENESIN ER 600 MG PO TB12
600.0000 mg | ORAL_TABLET | Freq: Two times a day (BID) | ORAL | Status: DC
  Administered 2023-08-24 – 2023-08-25 (×3): 600 mg via ORAL
  Filled 2023-08-24 (×3): qty 1

## 2023-08-24 NOTE — Assessment & Plan Note (Signed)
 CTA chest negative for PE Likely secondary to acute respiratory illness

## 2023-08-24 NOTE — Plan of Care (Signed)

## 2023-08-24 NOTE — Assessment & Plan Note (Signed)
 Stable findings Outpatient follow-up

## 2023-08-24 NOTE — Progress Notes (Signed)
  Progress Note   Patient: KIMORI TARTAGLIA FMW:[id number] DOB: 03/08/1950 DOA: 08/23/2023     0 DOS: the patient was seen and examined on 08/24/2023  Plan  Nonbillable note Patient admitted for COPD exacerbation with new oxygen requirement Currently on 2 L of intranasal oxygen and does not use any at home Still having evidence of wheezing We will continue current management as outlined in H&P by Dr. Cleatus earlier this morning.  CT chest also showing findings of 3.5 cm left lobe thyroid  nodule-patient counseled to have outpatient follow-up with ultrasound.  Vitals:   08/24/23 0334 08/24/23 0857 08/24/23 1118 08/24/23 1608  BP: (!) 172/78 (!) 143/74 (!) 140/85 (!) 156/80  Pulse: (!) 109 94 99 95  Resp:  18 18 18   Temp: 97.8 F (36.6 C) 98.5 F (36.9 C) 98.5 F (36.9 C) 98.2 F (36.8 C)  TempSrc: Oral     SpO2: 97% 100% 100% 100%  Weight: 85.9 kg     Height: 5' 4 (1.626 m)        Author: Drue ONEIDA Potter, MD 08/24/2023 4:40 PM  For on call review [email or web address].

## 2023-08-24 NOTE — ED Notes (Signed)
 Patient's sats dropped to 89%, HR got up to 133, very wheezing again, restless and feels like she can't catch her breath. Became very breathless at end and needed to take breaks. Sats back to 95-99% upon returning to bed. Dr Dicky made aware.

## 2023-08-24 NOTE — Progress Notes (Signed)
 Anticoagulation monitoring(Lovenox ):  73 yo female ordered Lovenox  40 mg Q24h    Filed Weights   08/23/23 2220  Weight: 88.8 kg (195 lb 12.3 oz)   BMI 33.6    Lab Results  Component Value Date   CREATININE 0.72 08/23/2023   CREATININE 0.87 08/11/2023   CREATININE 0.55 11/25/2022   Estimated Creatinine Clearance: 68.5 mL/min (by C-G formula based on SCr of 0.72 mg/dL). Hemoglobin & Hematocrit     Component Value Date/Time   HGB 13.1 08/23/2023 2230   HGB 13.5 07/24/2023 0952   HCT 41.3 08/23/2023 2230     Per Protocol for Patient with estCrcl > 30 ml/min and BMI > 30, will transition to Lovenox  45 mg Q24h.

## 2023-08-24 NOTE — Assessment & Plan Note (Signed)
 Normotensive Resume home meds pending med rec

## 2023-08-24 NOTE — Assessment & Plan Note (Signed)
 Scheduled and as needed nebulizers, IV steroids, antitussives Supplemental oxygen if needed

## 2023-08-24 NOTE — H&P (Addendum)
 History and Physical    Patient: Deborah Montgomery FMW:[id number] DOB: March 11, 1950 DOA: 08/23/2023 DOS: the patient was seen and examined on 08/24/2023 PCP: Harvey Gaetana CROME, NP  Patient coming from: Home  Chief Complaint:  Chief Complaint  Patient presents with   Shortness of Breath    HPI: Deborah Montgomery is a 73 y.o. female with medical history significant for COPD, hypertension, hyperlipidemia, being admitted with a COPD exacerbation.  She presented by EMS and received 2 DuoNebs and Solu-Medrol  en route.  She continued to be symptomatic following treatment in the ED, with desats to the high 80s and tachycardia with attempted ambulation.  She denies chest pain, fever or chills or lower extremity pain or swelling. In the ED was tachycardic to the low 100s.  Labs notable for WBC of 20,000 but otherwise unremarkable and negative respiratory viral panel.  EKG showed sinus tachycardia at a 106.  CTA chest negative for PE showing findings consistent with bronchitis or reactive airway disease also showed a stable 3.5 cm left thyroid  nodule. Admission requested     Past Medical History:  Diagnosis Date   Anxiety    Arthritis    Asthma    COPD (chronic obstructive pulmonary disease) (HCC)    a. diagnosed 05/2015   Essential hypertension    Eye problem    GERD (gastroesophageal reflux disease)    HLD (hyperlipidemia)    a. intolerant to simvastatin    Tobacco abuse    Past Surgical History:  Procedure Laterality Date   ABDOMINAL HYSTERECTOMY     CARDIAC CATHETERIZATION N/A 06/30/2015   Procedure: Left Heart Cath and Coronary Angiography;  Surgeon: Deatrice DELENA Cage, MD;  Location: [facility];  Service: Cardiovascular;  Laterality: N/A;   CARPAL TUNNEL RELEASE     Social History:  reports that she has been smoking cigarettes. She does not have any smokeless tobacco history on file. She reports that she does not drink alcohol and does not use drugs.  No Known Allergies  Family  History  Problem Relation Age of Onset   Lung cancer Brother    Breast cancer Paternal Aunt    Breast cancer Paternal Aunt     Prior to Admission medications   Medication Sig Start Date End Date Taking? Authorizing Provider  albuterol  (PROVENTIL ) (2.5 MG/3ML) 0.083% nebulizer solution Take 3 mLs (2.5 mg total) by nebulization every 4 (four) hours as needed for wheezing or shortness of breath. 11/27/22 11/27/23  Bryn Bernardino NOVAK, MD  albuterol  (VENTOLIN  HFA) [phone] Base) MCG/ACT inhaler Inhale 2 puffs into the lungs every 6 (six) hours as needed for wheezing or shortness of breath. 05/16/22   Cyrena Mylar, MD  amLODipine  (NORVASC ) 5 MG tablet Take 10 mg by mouth daily.    [provider]  aspirin  EC 81 MG EC tablet Take 1 tablet (81 mg total) by mouth daily. Patient not taking: Reported on 05/16/2022 07/01/15   Hower, Alm POUR, MD  diclofenac sodium (VOLTAREN) 1 % GEL Apply 2 g topically 4 (four) times daily as needed (for shoulder pain). Patient not taking: Reported on 12/09/2022    [provider]  ezetimibe  (ZETIA ) 10 MG tablet Take 10 mg by mouth daily.    [provider]  fluticasone  (FLONASE) 50 MCG/ACT nasal spray Place 2 sprays into both nostrils daily as needed for rhinitis.    [provider]  ibuprofen (ADVIL) 800 MG tablet Take 800 mg by mouth every 6 (six) hours as needed.  10/03/22   [provider]  latanoprost  (XALATAN ) 0.005 % ophthalmic solution Place 1 drop into both eyes at bedtime.    [provider]  losartan  (COZAAR ) 50 MG tablet Take 100 mg by mouth daily.    [provider]  montelukast  (SINGULAIR ) 10 MG tablet Take 10 mg by mouth daily. 04/24/22   [provider]  nicotine  polacrilex (NICOTINE  MINI) 4 MG lozenge Take 1 lozenge (4 mg total) by mouth as needed for smoking cessation. Patient not taking: Reported on 12/09/2022 05/16/22   Cyrena Mylar, MD  omeprazole (PRILOSEC) 40 MG capsule Take 40 mg by mouth  daily. Patient not taking: Reported on 12/09/2022    [provider]  pseudoephedrine-dextromethorphan-guaifenesin  (ROBITUSSIN-PE) 30-10-100 MG/5ML solution Take 10 mLs by mouth 4 (four) times daily as needed for cough. Patient not taking: Reported on 12/09/2022    [provider]  rosuvastatin  (CRESTOR ) 20 MG tablet Take 20 mg by mouth at bedtime. Patient not taking: Reported on 12/09/2022    [provider]  Spacer/Aero-Holding Chambers (AEROCHAMBER MV) inhaler Use as instructed 05/16/22   Cyrena Mylar, MD  SYMBICORT  160-4.5 MCG/ACT inhaler Inhale 2 puffs into the lungs in the morning and at bedtime. Patient not taking: Reported on 12/09/2022 11/27/22   Bryn Bernardino NOVAK, MD  tiotropium (SPIRIVA) 18 MCG inhalation capsule Place 18 mcg into inhaler and inhale daily.    [provider]  traZODone (DESYREL) 50 MG tablet Take 50 mg by mouth at bedtime. Patient not taking: Reported on 11/25/2022    [provider]  Vitamin D, Ergocalciferol, (DRISDOL) 1.25 MG (50000 UNIT) CAPS capsule Take 50,000 Units by mouth once a week. 10/03/22   [provider]    Physical Exam: Vitals:   08/23/23 2230 08/23/23 2330 08/24/23 0100 08/24/23 0200  BP: 138/67  136/61 (!) 135/57  Pulse: (!) 107 (!) 101 94 91  Resp: 16 16 20 16   Temp:    98.8 F (37.1 C)  TempSrc:    Oral  SpO2: 98% 99% 97% 93%  Weight:      Height:       Physical Exam Vitals and nursing note reviewed.  Constitutional:      General: She is not in acute distress. HENT:     Head: Normocephalic and atraumatic.  Cardiovascular:     Rate and Rhythm: Normal rate and regular rhythm.     Heart sounds: Normal heart sounds.  Pulmonary:     Effort: Pulmonary effort is normal.     Breath sounds: Decreased air movement present.  Abdominal:     Palpations: Abdomen is soft.     Tenderness: There is no abdominal tenderness.  Neurological:     Mental Status: Mental status is at baseline.      Labs on Admission: I have personally reviewed following labs and imaging studies  CBC: Recent Labs  Lab 08/23/23 2230  WBC 20.8*  HGB 13.1  HCT 41.3  MCV 88.4  PLT 430*   Basic Metabolic Panel: Recent Labs  Lab 08/23/23 2230  NA 142  K 4.5  CL 104  CO2 24  GLUCOSE 153*  BUN 17  CREATININE 0.72  CALCIUM  9.1   GFR: Estimated Creatinine Clearance: 68.5 mL/min (by C-G formula based on SCr of 0.72 mg/dL). Liver Function Tests: No results for input(s): AST, ALT, ALKPHOS, BILITOT, PROT, ALBUMIN in the last 168 hours. No results for input(s): LIPASE, AMYLASE in the last 168 hours. No results for input(s): AMMONIA in the  last 168 hours. Coagulation Profile: Recent Labs  Lab 08/23/23 2328  INR 0.9   Cardiac Enzymes: No results for input(s): CKTOTAL, CKMB, CKMBINDEX, TROPONINI in the last 168 hours. BNP (last 3 results) No results for input(s): PROBNP in the last 8760 hours. HbA1C: No results for input(s): HGBA1C in the last 72 hours. CBG: No results for input(s): GLUCAP in the last 168 hours. Lipid Profile: No results for input(s): CHOL, HDL, LDLCALC, TRIG, CHOLHDL, LDLDIRECT in the last 72 hours. Thyroid  Function Tests: No results for input(s): TSH, T4TOTAL, FREET4, T3FREE, THYROIDAB in the last 72 hours. Anemia Panel: No results for input(s): VITAMINB12, FOLATE, FERRITIN, TIBC, IRON, RETICCTPCT in the last 72 hours. Urine analysis:    Component Value Date/Time   COLORURINE YELLOW (A) 11/26/2022 1139   APPEARANCEUR CLEAR (A) 11/26/2022 1139   LABSPEC 1.023 11/26/2022 1139   PHURINE 5.0 11/26/2022 1139   GLUCOSEU NEGATIVE 11/26/2022 1139   HGBUR NEGATIVE 11/26/2022 1139   BILIRUBINUR NEGATIVE 11/26/2022 1139   KETONESUR NEGATIVE 11/26/2022 1139   PROTEINUR NEGATIVE 11/26/2022 1139   NITRITE NEGATIVE 11/26/2022 1139   LEUKOCYTESUR NEGATIVE 11/26/2022 1139    Radiological Exams on  Admission: CT Angio Chest PE W and/or Wo Contrast Result Date: 08/23/2023 CLINICAL DATA:  Respiratory distress, COPD EXAM: CT ANGIOGRAPHY CHEST WITH CONTRAST TECHNIQUE: Multidetector CT imaging of the chest was performed using the standard protocol during bolus administration of intravenous contrast. Multiplanar CT image reconstructions and MIPs were obtained to evaluate the vascular anatomy. RADIATION DOSE REDUCTION: This exam was performed according to the departmental dose-optimization program which includes automated exposure control, adjustment of the mA and/or kV according to patient size and/or use of iterative reconstruction technique. CONTRAST:  75mL OMNIPAQUE  IOHEXOL  350 MG/ML SOLN COMPARISON:  08/23/2023, 05/16/2022 FINDINGS: Cardiovascular: This is a technically adequate evaluation of the pulmonary vasculature. No filling defects or pulmonary emboli. The heart is unremarkable without pericardial effusion. No evidence of thoracic aortic aneurysm or dissection. Stable atherosclerosis of the aorta and coronary vasculature. Mediastinum/Nodes: No pathologic adenopathy. Stable heterogeneous enlargement of the left lobe thyroid  with hypodense nodule measuring up to 3.5 cm. Trachea and esophagus are unremarkable. Lungs/Pleura: Stable centrilobular emphysema, upper lobe predominant. No acute airspace disease, effusion, or pneumothorax. Minimal retained secretions within the right mainstem bronchus. Bilateral lower lobe bronchial wall thickening consistent with bronchitis or reactive airway disease. Upper Abdomen: No acute abnormality. Musculoskeletal: No acute or destructive bony abnormalities. Reconstructed images demonstrate no additional findings. Review of the MIP images confirms the above findings. IMPRESSION: 1. No evidence of pulmonary embolus. 2. Bilateral lower lobe bronchial wall thickening, consistent with bronchitis or reactive airway disease. 3. Stable 3.5 cm left lobe thyroid  nodule. Nonemergent  outpatient thyroid  ultrasound recommended if not previously performed. 4. Aortic Atherosclerosis (ICD10-I70.0) and Emphysema (ICD10-J43.9). Electronically Signed   By: Ozell Daring M.D.   On: 08/23/2023 23:58   DG Chest Port 1 View Result Date: 08/23/2023 EXAM: 1 VIEW XRAY OF THE CHEST 08/23/2023 10:53:42 PM COMPARISON: 08/11/2023 CLINICAL HISTORY: 141880 SOB (shortness of breath) [id number]. PER ER NOTE; PER ER NOTE from home via EMS for report of respiratory distress, has COPD. Similar visit the other day. Received 2 duonebs, 1 albuterol  and 125mg  of solumedrol. Patient reports breathing is much improved. FINDINGS: LUNGS AND PLEURA: Atelectasis or infiltrates in the bilateral lower lungs has increased compared to 08/11/23. No pleural effusion. No pneumothorax. HEART AND MEDIASTINUM: Stable cardiomediastinal silhouette. BONES AND SOFT TISSUES: No displaced rib fracture. IMPRESSION: 1. Increased atelectasis or infiltrates  in the bilateral lower lungs compared to 08/11/23. Electronically signed by: Norman Gatlin MD 08/23/2023 11:00 PM EDT RP Workstation: [id number]   Data Reviewed for HPI: Relevant notes from primary care and specialist visits, past discharge summaries as available in EHR, including Care Everywhere. Prior diagnostic testing as pertinent to current admission diagnoses Updated medications and problem lists for reconciliation ED course, including vitals, labs, imaging, treatment and response to treatment Triage notes, nursing and pharmacy notes and ED provider's notes Notable results as noted above in HPI      Assessment and Plan: * COPD (chronic obstructive pulmonary disease) with acute bronchitis (HCC) Scheduled and as needed nebulizers, IV steroids, antitussives Supplemental oxygen if needed  Sinus tachycardia CTA chest negative for PE Likely secondary to acute respiratory illness  Essential hypertension Normotensive Resume home meds pending med rec  Left thyroid   nodule Stable findings Outpatient follow-up     DVT prophylaxis: Lovenox   Consults: none  Advance Care Planning:   Code Status: Full Code   Family Communication: none  Disposition Plan: Back to previous home environment  Severity of Illness: The appropriate patient status for this patient is OBSERVATION. Observation status is judged to be reasonable and necessary in order to provide the required intensity of service to ensure the patient's safety. The patient's presenting symptoms, physical exam findings, and initial radiographic and laboratory data in the context of their medical condition is felt to place them at decreased risk for further clinical deterioration. Furthermore, it is anticipated that the patient will be medically stable for discharge from the hospital within 2 midnights of admission.   Author: Delayne LULLA Solian, MD 08/24/2023 3:06 AM  For on call review [email or web address].

## 2023-08-25 DIAGNOSIS — J209 Acute bronchitis, unspecified: Secondary | ICD-10-CM | POA: Diagnosis not present

## 2023-08-25 DIAGNOSIS — J440 Chronic obstructive pulmonary disease with acute lower respiratory infection: Secondary | ICD-10-CM | POA: Diagnosis not present

## 2023-08-25 LAB — CBC WITH DIFFERENTIAL/PLATELET
Abs Immature Granulocytes: 0.26 K/uL — ABNORMAL HIGH (ref 0.00–0.07)
Basophils Absolute: 0.1 K/uL (ref 0.0–0.1)
Basophils Relative: 0 %
Eosinophils Absolute: 0 K/uL (ref 0.0–0.5)
Eosinophils Relative: 0 %
HCT: 40.5 % (ref 36.0–46.0)
Hemoglobin: 12.6 g/dL (ref 12.0–15.0)
Immature Granulocytes: 1 %
Lymphocytes Relative: 5 %
Lymphs Abs: 1.4 K/uL (ref 0.7–4.0)
MCH: 27.9 pg (ref 26.0–34.0)
MCHC: 31.1 g/dL (ref 30.0–36.0)
MCV: 89.6 fL (ref 80.0–100.0)
Monocytes Absolute: 0.6 K/uL (ref 0.1–1.0)
Monocytes Relative: 2 %
Neutro Abs: 26.1 K/uL — ABNORMAL HIGH (ref 1.7–7.7)
Neutrophils Relative %: 92 %
Platelets: 441 K/uL — ABNORMAL HIGH (ref 150–400)
RBC: 4.52 MIL/uL (ref 3.87–5.11)
RDW: 13.9 % (ref 11.5–15.5)
Smear Review: NORMAL
WBC: 28.5 K/uL — ABNORMAL HIGH (ref 4.0–10.5)
nRBC: 0 % (ref 0.0–0.2)

## 2023-08-25 LAB — BASIC METABOLIC PANEL WITH GFR
Anion gap: 6 (ref 5–15)
BUN: 19 mg/dL (ref 8–23)
CO2: 27 mmol/L (ref 22–32)
Calcium: 9.2 mg/dL (ref 8.9–10.3)
Chloride: 106 mmol/L (ref 98–111)
Creatinine, Ser: 0.7 mg/dL (ref 0.44–1.00)
GFR, Estimated: >60 mL/min (ref >60)
Glucose, Bld: 146 mg/dL — ABNORMAL HIGH (ref 70–99)
Potassium: 4.4 mmol/L (ref 3.5–5.1)
Sodium: 139 mmol/L (ref 135–145)

## 2023-08-25 MED ORDER — PREDNISONE 20 MG PO TABS: 40.0000 mg | ORAL_TABLET | Freq: Every day | ORAL | 0 refills | Status: AC

## 2023-08-25 MED ORDER — AZITHROMYCIN 500 MG PO TABS
500.0000 mg | ORAL_TABLET | Freq: Every day | ORAL | Status: DC
  Administered 2023-08-25: 500 mg via ORAL
  Filled 2023-08-25: qty 1

## 2023-08-25 MED ORDER — AZITHROMYCIN 500 MG PO TABS: 500.0000 mg | ORAL_TABLET | Freq: Every day | ORAL | 0 refills | Status: AC

## 2023-08-25 NOTE — Discharge Summary (Signed)
 Physician Discharge Summary   Patient: Deborah Montgomery MRN: [id number] DOB: 21-Feb-1950  Admit date:     08/23/2023  Discharge date: 08/25/23  Discharge Physician: Drue ONEIDA Potter   PCP: Harvey Gaetana CROME, NP   Recommendations at discharge:  Follow-up with PCP as well as pulmonology  Discharge Diagnoses: COPD (chronic obstructive pulmonary disease) with acute bronchitis (HCC) Sinus tachycardia Essential hypertension Left thyroid  nodule  Hospital Course: Deborah Montgomery is a 73 y.o. female with medical history significant for COPD, hypertension, hyperlipidemia, being admitted with a COPD exacerbation.  She presented by EMS and received 2 DuoNebs and Solu-Medrol  en route.  Found to have had saturation in the high 80s with wheezing concerning for COPD exacerbation and therefore admitted for further management.  CTA chest negative for PE showing findings consistent with bronchitis or reactive airway disease also showed a stable 3.5 cm left thyroid  nodule.  Respiratory function improved and patient was weaned off oxygen.  Leukocytosis thought to be due to steroid effect.  Patient is feeling much better and therefore being discharged home to follow-up with PCP as well as pulmonary.  Patient also encouraged to have follow-up ultrasound to evaluate for thyroid  nodule.   Consultants: None Procedures performed: None Disposition: Home Diet recommendation:  Cardiac diet DISCHARGE MEDICATION: Allergies as of 08/25/2023   No Known Allergies      Medication List     TAKE these medications    AeroChamber MV inhaler Use as instructed   albuterol  108 (90 Base) MCG/ACT inhaler Commonly known as: VENTOLIN  HFA Inhale 2 puffs into the lungs every 6 (six) hours as needed for wheezing or shortness of breath.   albuterol  (2.5 MG/3ML) 0.083% nebulizer solution Commonly known as: PROVENTIL  Take 3 mLs (2.5 mg total) by nebulization every 4 (four) hours as needed for wheezing or shortness of breath.    amLODipine  5 MG tablet Commonly known as: NORVASC  Take 10 mg by mouth daily.   aspirin  EC 81 MG tablet Take 1 tablet (81 mg total) by mouth daily.   azithromycin  500 MG tablet Commonly known as: ZITHROMAX  Take 1 tablet (500 mg total) by mouth daily for 3 days. Start taking on: August 26, 2023   diclofenac sodium 1 % Gel Commonly known as: VOLTAREN Apply 2 g topically 4 (four) times daily as needed (for shoulder pain).   ezetimibe  10 MG tablet Commonly known as: ZETIA  Take 10 mg by mouth daily.   fluticasone  50 MCG/ACT nasal spray Commonly known as: FLONASE Place 2 sprays into both nostrils daily as needed for rhinitis.   ibuprofen 800 MG tablet Commonly known as: ADVIL Take 800 mg by mouth every 6 (six) hours as needed.   latanoprost  0.005 % ophthalmic solution Commonly known as: XALATAN  Place 1 drop into both eyes at bedtime.   losartan  50 MG tablet Commonly known as: COZAAR  Take 100 mg by mouth daily.   montelukast  10 MG tablet Commonly known as: SINGULAIR  Take 10 mg by mouth daily.   nicotine  polacrilex 4 MG lozenge Commonly known as: Nicotine  Mini Take 1 lozenge (4 mg total) by mouth as needed for smoking cessation.   omeprazole 40 MG capsule Commonly known as: PRILOSEC Take 40 mg by mouth daily.   predniSONE  20 MG tablet Commonly known as: DELTASONE  Take 2 tablets (40 mg total) by mouth daily with breakfast for 4 days.   pseudoephedrine-dextromethorphan-guaifenesin  30-10-100 MG/5ML solution Commonly known as: ROBITUSSIN-PE Take 10 mLs by mouth 4 (four) times daily as needed for cough.  rosuvastatin  20 MG tablet Commonly known as: CRESTOR  Take 20 mg by mouth at bedtime.   Symbicort  160-4.5 MCG/ACT inhaler Generic drug: budesonide-formoterol  Inhale 2 puffs into the lungs in the morning and at bedtime.   tiotropium 18 MCG inhalation capsule Commonly known as: SPIRIVA Place 18 mcg into inhaler and inhale daily.   traZODone 50 MG tablet Commonly  known as: DESYREL Take 50 mg by mouth at bedtime.   Vitamin D (Ergocalciferol) 1.25 MG (50000 UNIT) Caps capsule Commonly known as: DRISDOL Take 50,000 Units by mouth once a week.        Follow-up Information     Fields, Gaetana CROME, NP Follow up.   Specialty: Family Medicine Why: hospital follow up Contact information: [address] [place] [address] [phone]         Parris Manna, MD. Call.   Specialty: Pulmonary Disease Contact information: [address] [place] [address] [phone]                Discharge Exam: Fredricka Weights   08/23/23 2220 08/24/23 0334  Weight: 88.8 kg 85.9 kg   Vitals and nursing note reviewed.  Constitutional:      General: She is not in acute distress. HENT:     Head: Normocephalic and atraumatic.  Cardiovascular:     Rate and Rhythm: Normal rate and regular rhythm.     Heart sounds: Normal heart sounds.  Pulmonary:     Effort: Pulmonary effort is normal.     Breath sounds: Decreased air movement present.  Abdominal:     Palpations: Abdomen is soft.     Tenderness: There is no abdominal tenderness.  Neurological:     Mental Status: Mental status is at baseline.   Condition at discharge: good  The results of significant diagnostics from this hospitalization (including imaging, microbiology, ancillary and laboratory) are listed below for reference.   Imaging Studies: CT Angio Chest PE W and/or Wo Contrast Result Date: 08/23/2023 CLINICAL DATA:  Respiratory distress, COPD EXAM: CT ANGIOGRAPHY CHEST WITH CONTRAST TECHNIQUE: Multidetector CT imaging of the chest was performed using the standard protocol during bolus administration of intravenous contrast. Multiplanar CT image reconstructions and MIPs were obtained to evaluate the vascular anatomy. RADIATION DOSE REDUCTION: This exam was performed according to the departmental dose-optimization program which includes automated exposure control,  adjustment of the mA and/or kV according to patient size and/or use of iterative reconstruction technique. CONTRAST:  75mL OMNIPAQUE  IOHEXOL  350 MG/ML SOLN COMPARISON:  08/23/2023, 05/16/2022 FINDINGS: Cardiovascular: This is a technically adequate evaluation of the pulmonary vasculature. No filling defects or pulmonary emboli. The heart is unremarkable without pericardial effusion. No evidence of thoracic aortic aneurysm or dissection. Stable atherosclerosis of the aorta and coronary vasculature. Mediastinum/Nodes: No pathologic adenopathy. Stable heterogeneous enlargement of the left lobe thyroid  with hypodense nodule measuring up to 3.5 cm. Trachea and esophagus are unremarkable. Lungs/Pleura: Stable centrilobular emphysema, upper lobe predominant. No acute airspace disease, effusion, or pneumothorax. Minimal retained secretions within the right mainstem bronchus. Bilateral lower lobe bronchial wall thickening consistent with bronchitis or reactive airway disease. Upper Abdomen: No acute abnormality. Musculoskeletal: No acute or destructive bony abnormalities. Reconstructed images demonstrate no additional findings. Review of the MIP images confirms the above findings. IMPRESSION: 1. No evidence of pulmonary embolus. 2. Bilateral lower lobe bronchial wall thickening, consistent with bronchitis or reactive airway disease. 3. Stable 3.5 cm left lobe thyroid  nodule. Nonemergent outpatient thyroid  ultrasound recommended if not previously performed. 4. Aortic Atherosclerosis (  ICD10-I70.0) and Emphysema (ICD10-J43.9). Electronically Signed   By: Ozell Daring M.D.   On: 08/23/2023 23:58   DG Chest Port 1 View Result Date: 08/23/2023 EXAM: 1 VIEW XRAY OF THE CHEST 08/23/2023 10:53:42 PM COMPARISON: 08/11/2023 CLINICAL HISTORY: 141880 SOB (shortness of breath) 141880. PER ER NOTE; PER ER NOTE from home via EMS for report of respiratory distress, has COPD. Similar visit the other day. Received 2 duonebs, 1 albuterol  and  125mg  of solumedrol. Patient reports breathing is much improved. FINDINGS: LUNGS AND PLEURA: Atelectasis or infiltrates in the bilateral lower lungs has increased compared to 08/11/23. No pleural effusion. No pneumothorax. HEART AND MEDIASTINUM: Stable cardiomediastinal silhouette. BONES AND SOFT TISSUES: No displaced rib fracture. IMPRESSION: 1. Increased atelectasis or infiltrates in the bilateral lower lungs compared to 08/11/23. Electronically signed by: Norman Gatlin MD 08/23/2023 11:00 PM EDT RP Workstation: [id number]   DG Chest 2 View Result Date: 08/11/2023 CLINICAL DATA:  Shortness of breath EXAM: CHEST - 2 VIEW COMPARISON:  11/25/2022 FINDINGS: The heart size and mediastinal contours are within normal limits. Both lungs are clear. The visualized skeletal structures are unremarkable. IMPRESSION: No active cardiopulmonary disease. Electronically Signed   By: Luke Bun M.D.   On: 08/11/2023 22:25    Microbiology: Results for orders placed or performed during the hospital encounter of 08/23/23  Blood Culture (routine x 2)     Status: None (Preliminary result)   Collection Time: 08/23/23 11:28 PM   Specimen: BLOOD LEFT ARM  Result Value Ref Range Status   Specimen Description BLOOD LEFT ARM  Final   Special Requests   Final    BOTTLES DRAWN AEROBIC AND ANAEROBIC Blood Culture adequate volume   Culture   Final    NO GROWTH < 12 HOURS Performed at [facility], [address] [place], [place] [address]    Report Status PENDING  Incomplete  Resp panel by RT-PCR (RSV, Flu A&B, Covid) Anterior Nasal Swab     Status: None   Collection Time: 08/23/23 11:29 PM   Specimen: Anterior Nasal Swab  Result Value Ref Range Status   SARS Coronavirus 2 by RT PCR NEGATIVE NEGATIVE Final    Comment: (NOTE) SARS-CoV-2 target nucleic acids are NOT DETECTED.  The SARS-CoV-2 RNA is generally detectable in upper respiratory specimens during the acute phase of infection. The  lowest concentration of SARS-CoV-2 viral copies this assay can detect is 138 copies/mL. A negative result does not preclude SARS-Cov-2 infection and should not be used as the sole basis for treatment or other patient management decisions. A negative result may occur with  improper specimen collection/handling, submission of specimen other than nasopharyngeal swab, presence of viral mutation(s) within the areas targeted by this assay, and inadequate number of viral copies(<138 copies/mL). A negative result must be combined with clinical observations, patient history, and epidemiological information. The expected result is Negative.  Fact Sheet for Patients:  BloggerCourse.com  Fact Sheet for Healthcare Providers:  SeriousBroker.it  This test is no t yet approved or cleared by the [place]  FDA and  has been authorized for detection and/or diagnosis of SARS-CoV-2 by FDA under an Emergency Use Authorization (EUA). This EUA will remain  in effect (meaning this test can be used) for the duration of the COVID-19 declaration under Section 564(b)(1) of the Act, 21 U.S.C.section 360bbb-3(b)(1), unless the authorization is terminated  or revoked sooner.       Influenza A by PCR NEGATIVE NEGATIVE Final   Influenza B by PCR NEGATIVE  NEGATIVE Final    Comment: (NOTE) The Xpert Xpress SARS-CoV-2/FLU/RSV plus assay is intended as an aid in the diagnosis of influenza from Nasopharyngeal swab specimens and should not be used as a sole basis for treatment. Nasal washings and aspirates are unacceptable for Xpert Xpress SARS-CoV-2/FLU/RSV testing.  Fact Sheet for Patients: BloggerCourse.com  Fact Sheet for Healthcare Providers: SeriousBroker.it  This test is not yet approved or cleared by the [place]  FDA and has been authorized for detection and/or diagnosis of SARS-CoV-2 by FDA under  an Emergency Use Authorization (EUA). This EUA will remain in effect (meaning this test can be used) for the duration of the COVID-19 declaration under Section 564(b)(1) of the Act, 21 U.S.C. section 360bbb-3(b)(1), unless the authorization is terminated or revoked.     Resp Syncytial Virus by PCR NEGATIVE NEGATIVE Final    Comment: (NOTE) Fact Sheet for Patients: BloggerCourse.com  Fact Sheet for Healthcare Providers: SeriousBroker.it  This test is not yet approved or cleared by the [place]  FDA and has been authorized for detection and/or diagnosis of SARS-CoV-2 by FDA under an Emergency Use Authorization (EUA). This EUA will remain in effect (meaning this test can be used) for the duration of the COVID-19 declaration under Section 564(b)(1) of the Act, 21 U.S.C. section 360bbb-3(b)(1), unless the authorization is terminated or revoked.  Performed at [facility], [address] [place], [place] [address]   Blood Culture (routine x 2)     Status: None (Preliminary result)   Collection Time: 08/23/23 11:37 PM   Specimen: BLOOD RIGHT ARM  Result Value Ref Range Status   Specimen Description BLOOD RIGHT ARM  Final   Special Requests   Final    BOTTLES DRAWN AEROBIC AND ANAEROBIC Blood Culture adequate volume   Culture   Final    NO GROWTH < 12 HOURS Performed at [facility], [address] [place], [place] [address]    Report Status PENDING  Incomplete    Labs: CBC: Recent Labs  Lab 08/23/23 2230 08/25/23 0558  WBC 20.8* 28.5*  NEUTROABS  --  26.1*  HGB 13.1 12.6  HCT 41.3 40.5  MCV 88.4 89.6  PLT 430* 441*   Basic Metabolic Panel: Recent Labs  Lab 08/23/23 2230 08/25/23 0558  NA 142 139  K 4.5 4.4  CL 104 106  CO2 24 27  GLUCOSE 153* 146*  BUN 17 19  CREATININE 0.72 0.70  CALCIUM  9.1 9.2   Liver Function Tests: No results for input(s): AST, ALT, ALKPHOS, BILITOT,  PROT, ALBUMIN in the last 168 hours. CBG: No results for input(s): GLUCAP in the last 168 hours.  Discharge time spent:  39 minutes.  Signed: Drue ONEIDA Potter, MD [facility] 08/25/2023

## 2023-08-25 NOTE — Plan of Care (Signed)

## 2023-08-25 NOTE — Care Management Obs Status (Signed)
 MEDICARE OBSERVATION STATUS NOTIFICATION   Patient Details  Name: Deborah Montgomery MRN: [id number] Date of Birth: [birth year]   Medicare Observation Status Notification Given:  Yes    Crytal Pensinger W, CMA 08/25/2023, 10:16 AM

## 2023-08-28 LAB — CULTURE, BLOOD (ROUTINE X 2)
Culture: NO GROWTH
Special Requests: ADEQUATE

## 2023-08-28 NOTE — Progress Notes (Signed)
 Dr Maree and Dr Dorinda made aware that [facility] lab called and reported blood culture anaerobic bottle growing gram positive rods, MDs acknowledged

## 2023-08-28 NOTE — Progress Notes (Addendum)
 PHARMACY - PHYSICIAN COMMUNICATION CRITICAL VALUE ALERT - BLOOD CULTURE IDENTIFICATION (BCID)  Deborah Montgomery is an 73 y.o. female who presented to [facility] on 08/23/2023 with a chief complaint of shortness of breath.   Assessment:  blood culture from 8/2 with GPR in 1 of 4 bottles.  No BCID performed.  Culture became positive on 8/7 (5 days). She being treated for COPD and was discharged on 8/4   Name of physician (or Provider) Contacted: Deborah Montgomery  Current antibiotics: discharged on azithromycin    Changes to prescribed antibiotics recommended:  Based on time to positivity of 5 days and GPR being common blood culture contaminant,  reviewing chart this blood culture is consistent with contaminant.  No further action required at this time  No results found for this or any previous visit.  Johnice Riebe, PharmD, BCPS, BCIDP Work Cell: [phone] 08/28/2023 2:23 PM

## 2023-08-30 LAB — CULTURE, BLOOD (ROUTINE X 2): Special Requests: ADEQUATE

## 2023-09-10 ENCOUNTER — Encounter: Payer: Self-pay | Admitting: Emergency Medicine

## 2023-09-10 ENCOUNTER — Emergency Department

## 2023-09-10 ENCOUNTER — Inpatient Hospital Stay
Admission: EM | Admit: 2023-09-10 | Discharge: 2023-09-12 | DRG: 190 | Disposition: A | Discharge status: Home / Self Care | Attending: Family Medicine | Admitting: Family Medicine

## 2023-09-10 ENCOUNTER — Other Ambulatory Visit: Payer: Self-pay

## 2023-09-10 DIAGNOSIS — Z79899 Other long term (current) drug therapy: Secondary | ICD-10-CM

## 2023-09-10 DIAGNOSIS — Z90710 Acquired absence of both cervix and uterus: Secondary | ICD-10-CM

## 2023-09-10 DIAGNOSIS — Z87891 Personal history of nicotine dependence: Secondary | ICD-10-CM

## 2023-09-10 DIAGNOSIS — Z7982 Long term (current) use of aspirin: Secondary | ICD-10-CM

## 2023-09-10 DIAGNOSIS — Z803 Family history of malignant neoplasm of breast: Secondary | ICD-10-CM

## 2023-09-10 DIAGNOSIS — Z801 Family history of malignant neoplasm of trachea, bronchus and lung: Secondary | ICD-10-CM

## 2023-09-10 DIAGNOSIS — Z1152 Encounter for screening for COVID-19: Secondary | ICD-10-CM

## 2023-09-10 DIAGNOSIS — A419 Sepsis, unspecified organism: Secondary | ICD-10-CM

## 2023-09-10 DIAGNOSIS — J441 Chronic obstructive pulmonary disease with (acute) exacerbation: Principal | ICD-10-CM | POA: Diagnosis present

## 2023-09-10 DIAGNOSIS — K219 Gastro-esophageal reflux disease without esophagitis: Secondary | ICD-10-CM | POA: Diagnosis present

## 2023-09-10 DIAGNOSIS — E785 Hyperlipidemia, unspecified: Secondary | ICD-10-CM | POA: Diagnosis present

## 2023-09-10 DIAGNOSIS — J9601 Acute respiratory failure with hypoxia: Secondary | ICD-10-CM | POA: Diagnosis present

## 2023-09-10 DIAGNOSIS — I10 Essential (primary) hypertension: Secondary | ICD-10-CM | POA: Diagnosis present

## 2023-09-10 LAB — CBC WITH DIFFERENTIAL/PLATELET
Abs Immature Granulocytes: 0.08 K/uL — ABNORMAL HIGH (ref 0.00–0.07)
Basophils Absolute: 0.1 K/uL (ref 0.0–0.1)
Basophils Relative: 0 %
Eosinophils Absolute: 0.7 K/uL — ABNORMAL HIGH (ref 0.0–0.5)
Eosinophils Relative: 4 %
HCT: 41.3 % (ref 36.0–46.0)
Hemoglobin: 13.1 g/dL (ref 12.0–15.0)
Immature Granulocytes: 0 %
Lymphocytes Relative: 20 %
Lymphs Abs: 3.6 K/uL (ref 0.7–4.0)
MCH: 28.1 pg (ref 26.0–34.0)
MCHC: 31.7 g/dL (ref 30.0–36.0)
MCV: 88.4 fL (ref 80.0–100.0)
Monocytes Absolute: 0.9 K/uL (ref 0.1–1.0)
Monocytes Relative: 5 %
Neutro Abs: 12.8 K/uL — ABNORMAL HIGH (ref 1.7–7.7)
Neutrophils Relative %: 71 %
Platelets: 445 K/uL — ABNORMAL HIGH (ref 150–400)
RBC: 4.67 MIL/uL (ref 3.87–5.11)
RDW: 14.1 % (ref 11.5–15.5)
WBC: 18.1 K/uL — ABNORMAL HIGH (ref 4.0–10.5)
nRBC: 0 % (ref 0.0–0.2)

## 2023-09-10 LAB — COMPREHENSIVE METABOLIC PANEL WITH GFR
ALT: 18 U/L (ref 0–44)
AST: 20 U/L (ref 15–41)
Albumin: 3.8 g/dL (ref 3.5–5.0)
Alkaline Phosphatase: 72 U/L (ref 38–126)
Anion gap: 6 (ref 5–15)
BUN: 11 mg/dL (ref 8–23)
CO2: 33 mmol/L — ABNORMAL HIGH (ref 22–32)
Calcium: 9.2 mg/dL (ref 8.9–10.3)
Chloride: 103 mmol/L (ref 98–111)
Creatinine, Ser: 0.58 mg/dL (ref 0.44–1.00)
GFR, Estimated: >60 mL/min (ref >60)
Glucose, Bld: 108 mg/dL — ABNORMAL HIGH (ref 70–99)
Potassium: 3.6 mmol/L (ref 3.5–5.1)
Sodium: 142 mmol/L (ref 135–145)
Total Bilirubin: <0.2 mg/dL (ref 0.0–1.2)
Total Protein: 7.3 g/dL (ref 6.5–8.1)

## 2023-09-10 LAB — BLOOD GAS, VENOUS
Acid-Base Excess: 6.3 mmol/L — ABNORMAL HIGH (ref 0.0–2.0)
Bicarbonate: 32.3 mmol/L — ABNORMAL HIGH (ref 20.0–28.0)
O2 Saturation: 59.2 %
Patient temperature: 37
pCO2, Ven: 51 mmHg (ref 44–60)
pH, Ven: 7.41 (ref 7.25–7.43)
pO2, Ven: 36 mmHg (ref 32–45)

## 2023-09-10 LAB — PROCALCITONIN: Procalcitonin: <0.1 ng/mL

## 2023-09-10 LAB — BRAIN NATRIURETIC PEPTIDE: B Natriuretic Peptide: 33 pg/mL (ref 0.0–100.0)

## 2023-09-10 LAB — TROPONIN I (HIGH SENSITIVITY)
Troponin I (High Sensitivity): 13 ng/L (ref <18)
Troponin I (High Sensitivity): 16 ng/L (ref <18)

## 2023-09-10 LAB — RESP PANEL BY RT-PCR (RSV, FLU A&B, COVID)  RVPGX2
Influenza A by PCR: NEGATIVE
Influenza B by PCR: NEGATIVE
Resp Syncytial Virus by PCR: NEGATIVE
SARS Coronavirus 2 by RT PCR: NEGATIVE

## 2023-09-10 LAB — LACTIC ACID, PLASMA: Lactic Acid, Venous: 1.2 mmol/L (ref 0.5–1.9)

## 2023-09-10 MED ORDER — METHYLPREDNISOLONE SODIUM SUCC 40 MG IJ SOLR
40.0000 mg | Freq: Two times a day (BID) | INTRAMUSCULAR | Status: AC
  Administered 2023-09-10 (×2): 40 mg via INTRAVENOUS
  Filled 2023-09-10 (×2): qty 1

## 2023-09-10 MED ORDER — LATANOPROST 0.005 % OP SOLN
1.0000 [drp] | Freq: Every day | OPHTHALMIC | Status: DC
  Administered 2023-09-10 – 2023-09-11 (×2): 1 [drp] via OPHTHALMIC
  Filled 2023-09-10 (×2): qty 2.5

## 2023-09-10 MED ORDER — ONDANSETRON HCL 4 MG PO TABS: 4.0000 mg | ORAL_TABLET | Freq: Four times a day (QID) | ORAL | Status: DC | PRN

## 2023-09-10 MED ORDER — ALBUTEROL SULFATE (2.5 MG/3ML) 0.083% IN NEBU
2.5000 mg | INHALATION_SOLUTION | Freq: Three times a day (TID) | RESPIRATORY_TRACT | Status: DC
  Administered 2023-09-11 – 2023-09-12 (×4): 2.5 mg via RESPIRATORY_TRACT
  Filled 2023-09-10 (×4): qty 3

## 2023-09-10 MED ORDER — ALBUTEROL SULFATE (2.5 MG/3ML) 0.083% IN NEBU
2.5000 mg | INHALATION_SOLUTION | RESPIRATORY_TRACT | Status: DC
  Administered 2023-09-10 (×3): 2.5 mg via RESPIRATORY_TRACT
  Filled 2023-09-10 (×3): qty 3

## 2023-09-10 MED ORDER — LOSARTAN POTASSIUM 50 MG PO TABS
100.0000 mg | ORAL_TABLET | Freq: Every day | ORAL | Status: DC
  Administered 2023-09-10 – 2023-09-12 (×3): 100 mg via ORAL
  Filled 2023-09-10 (×3): qty 2

## 2023-09-10 MED ORDER — DOXYCYCLINE HYCLATE 100 MG PO TABS
100.0000 mg | ORAL_TABLET | Freq: Once | ORAL | Status: AC
  Administered 2023-09-10: 100 mg via ORAL
  Filled 2023-09-10: qty 1

## 2023-09-10 MED ORDER — SODIUM CHLORIDE 0.9 % IV BOLUS
1000.0000 mL | Freq: Once | INTRAVENOUS | Status: AC
  Administered 2023-09-10: 1000 mL via INTRAVENOUS

## 2023-09-10 MED ORDER — ENOXAPARIN SODIUM 40 MG/0.4ML IJ SOSY
40.0000 mg | PREFILLED_SYRINGE | INTRAMUSCULAR | Status: DC
  Administered 2023-09-10 – 2023-09-11 (×2): 40 mg via SUBCUTANEOUS
  Filled 2023-09-10 (×2): qty 0.4

## 2023-09-10 MED ORDER — PREDNISONE 20 MG PO TABS
40.0000 mg | ORAL_TABLET | Freq: Every day | ORAL | Status: DC
  Administered 2023-09-11 – 2023-09-12 (×2): 40 mg via ORAL
  Filled 2023-09-10 (×2): qty 2

## 2023-09-10 MED ORDER — EZETIMIBE 10 MG PO TABS
10.0000 mg | ORAL_TABLET | Freq: Every day | ORAL | Status: DC
  Administered 2023-09-10 – 2023-09-12 (×3): 10 mg via ORAL
  Filled 2023-09-10 (×3): qty 1

## 2023-09-10 MED ORDER — AMLODIPINE BESYLATE 10 MG PO TABS
10.0000 mg | ORAL_TABLET | Freq: Every day | ORAL | Status: DC
  Administered 2023-09-10 – 2023-09-12 (×3): 10 mg via ORAL
  Filled 2023-09-10 (×3): qty 1

## 2023-09-10 MED ORDER — MONTELUKAST SODIUM 10 MG PO TABS
10.0000 mg | ORAL_TABLET | Freq: Every day | ORAL | Status: DC
  Administered 2023-09-11 – 2023-09-12 (×2): 10 mg via ORAL
  Filled 2023-09-10 (×2): qty 1

## 2023-09-10 MED ORDER — FLUTICASONE PROPIONATE 50 MCG/ACT NA SUSP: 2.0000 | Freq: Every day | NASAL | Status: DC | PRN

## 2023-09-10 MED ORDER — ACETAMINOPHEN 325 MG PO TABS: 650.0000 mg | ORAL_TABLET | Freq: Four times a day (QID) | ORAL | Status: DC | PRN

## 2023-09-10 MED ORDER — TIOTROPIUM BROMIDE MONOHYDRATE 18 MCG IN CAPS: 18.0000 ug | ORAL_CAPSULE | Freq: Every day | RESPIRATORY_TRACT | Status: DC

## 2023-09-10 MED ORDER — ACETAMINOPHEN 650 MG RE SUPP: 650.0000 mg | Freq: Four times a day (QID) | RECTAL | Status: DC | PRN

## 2023-09-10 MED ORDER — UMECLIDINIUM BROMIDE 62.5 MCG/ACT IN AEPB
1.0000 | INHALATION_SPRAY | Freq: Every day | RESPIRATORY_TRACT | Status: DC
  Administered 2023-09-11 – 2023-09-12 (×2): 1 via RESPIRATORY_TRACT
  Filled 2023-09-10 (×2): qty 7

## 2023-09-10 MED ORDER — ASPIRIN 81 MG PO TBEC: 81.0000 mg | DELAYED_RELEASE_TABLET | Freq: Every day | ORAL | Status: DC

## 2023-09-10 MED ORDER — BUDESONIDE 0.5 MG/2ML IN SUSP
2.0000 mg | Freq: Two times a day (BID) | RESPIRATORY_TRACT | Status: DC
  Administered 2023-09-10 – 2023-09-11 (×3): 2 mg via RESPIRATORY_TRACT
  Filled 2023-09-10 (×3): qty 8

## 2023-09-10 MED ORDER — ONDANSETRON HCL 4 MG/2ML IJ SOLN: 4.0000 mg | Freq: Four times a day (QID) | INTRAMUSCULAR | Status: DC | PRN

## 2023-09-10 MED ORDER — SODIUM CHLORIDE 0.9 % IV SOLN
2.0000 g | Freq: Once | INTRAVENOUS | Status: AC
  Administered 2023-09-10: 2 g via INTRAVENOUS
  Filled 2023-09-10: qty 20

## 2023-09-10 MED ORDER — ALBUTEROL SULFATE (2.5 MG/3ML) 0.083% IN NEBU
3.0000 mL | INHALATION_SOLUTION | Freq: Four times a day (QID) | RESPIRATORY_TRACT | Status: DC | PRN
  Administered 2023-09-11: 3 mL via RESPIRATORY_TRACT
  Filled 2023-09-10: qty 3

## 2023-09-10 NOTE — H&P (Signed)
 History and Physical    Deborah Montgomery FMW:[id number] DOB: 1950-05-05 DOA: 09/10/2023  PCP: Harvey Gaetana CROME, NP (Confirm with patient/family/NH records and if not entered, this has to be entered at [facility] point of entry) Patient coming from: Home  I have personally briefly reviewed patient's old medical records in [facility]  Chief Complaint: Cough, wheezing, SOB  HPI: Deborah Montgomery is a 73 y.o. female with medical history significant of COPD Gold stage II, HTN, HLD, presented with worsening of cough wheezing shortness of breath.  Symptoms started 2 days ago, patient started develop dry cough and wheezing, associated with worsening of exertional dyspnea.  Last night symptoms became worse, could not sleep because of severe shortness of breath and decided to come to ED.  Patient was just recently hospitalized 2 weeks ago for similar presentation, workup including a CTA negative for PE showed COPD exacerbation patient was treated with IV Solu-Medrol  and discharged home.  Her symptoms initially improved significantly until 2 days ago.  She denied any current smoking or secondhand smoking, she said that she has been always  allergic to something and plan to see a allergist in [place] area  ED Course: Afebrile, tachycardia tachypneic blood pressure 170/70 O2 saturation 96% on room air.  Chest x-ray negative for acute findings, patient became tachypneic and tachycardia with short ambulation after Solu-Medrol  treatment and albuterol  treatment.  Blood work showed WBC 18.1 hemoglobin 13 BUN 11 creatinine 0.8 bicarb 33.  Review of Systems: As per HPI otherwise 14 point review of systems negative.    Past Medical History:  Diagnosis Date   Anxiety    Arthritis    Asthma    COPD (chronic obstructive pulmonary disease) (HCC)    a. diagnosed 05/2015   Essential hypertension    Eye problem    GERD (gastroesophageal reflux disease)    HLD (hyperlipidemia)    a. intolerant to simvastatin     Tobacco abuse     Past Surgical History:  Procedure Laterality Date   ABDOMINAL HYSTERECTOMY     CARDIAC CATHETERIZATION N/A 06/30/2015   Procedure: Left Heart Cath and Coronary Angiography;  Surgeon: Deatrice DELENA Cage, MD;  Location: [facility] INVASIVE CV LAB;  Service: Cardiovascular;  Laterality: N/A;   CARPAL TUNNEL RELEASE       reports that she has been smoking cigarettes. She does not have any smokeless tobacco history on file. She reports that she does not drink alcohol and does not use drugs.  No Known Allergies  Family History  Problem Relation Age of Onset   Lung cancer Brother    Breast cancer Paternal Aunt    Breast cancer Paternal Aunt      Prior to Admission medications   Medication Sig Start Date End Date Taking? Authorizing Provider  albuterol  (PROVENTIL ) (2.5 MG/3ML) 0.083% nebulizer solution Take 3 mLs (2.5 mg total) by nebulization every 4 (four) hours as needed for wheezing or shortness of breath. 11/27/22 11/27/23  Bryn Bernardino NOVAK, MD  albuterol  (VENTOLIN  HFA) 108 [phone] Base) MCG/ACT inhaler Inhale 2 puffs into the lungs every 6 (six) hours as needed for wheezing or shortness of breath. 05/16/22   Cyrena Mylar, MD  amLODipine  (NORVASC ) 5 MG tablet Take 10 mg by mouth daily.    [provider]  aspirin  EC 81 MG EC tablet Take 1 tablet (81 mg total) by mouth daily. Patient not taking: Reported on 05/16/2022 07/01/15   Hower, Alm POUR, MD  diclofenac sodium (VOLTAREN) 1 % GEL  Apply 2 g topically 4 (four) times daily as needed (for shoulder pain). Patient not taking: Reported on 12/09/2022    [provider]  ezetimibe  (ZETIA ) 10 MG tablet Take 10 mg by mouth daily.    [provider]  fluticasone  (FLONASE ) 50 MCG/ACT nasal spray Place 2 sprays into both nostrils daily as needed for rhinitis.    [provider]  ibuprofen (ADVIL) 800 MG tablet Take 800 mg by mouth every 6 (six) hours as needed. 10/03/22   [provider]  latanoprost   (XALATAN ) 0.005 % ophthalmic solution Place 1 drop into both eyes at bedtime.    [provider]  losartan  (COZAAR ) 50 MG tablet Take 100 mg by mouth daily.    [provider]  montelukast  (SINGULAIR ) 10 MG tablet Take 10 mg by mouth daily. 04/24/22   [provider]  nicotine  polacrilex (NICOTINE  MINI) 4 MG lozenge Take 1 lozenge (4 mg total) by mouth as needed for smoking cessation. Patient not taking: Reported on 12/09/2022 05/16/22   Cyrena Mylar, MD  omeprazole (PRILOSEC) 40 MG capsule Take 40 mg by mouth daily. Patient not taking: Reported on 12/09/2022    [provider]  pseudoephedrine-dextromethorphan -guaifenesin  (ROBITUSSIN-PE) 30-10-100 MG/5ML solution Take 10 mLs by mouth 4 (four) times daily as needed for cough. Patient not taking: Reported on 12/09/2022    [provider]  rosuvastatin  (CRESTOR ) 20 MG tablet Take 20 mg by mouth at bedtime. Patient not taking: Reported on 12/09/2022    [provider]  Spacer/Aero-Holding Chambers (AEROCHAMBER MV) inhaler Use as instructed 05/16/22   Cyrena Mylar, MD  SYMBICORT  160-4.5 MCG/ACT inhaler Inhale 2 puffs into the lungs in the morning and at bedtime. Patient not taking: Reported on 12/09/2022 11/27/22   Bryn Bernardino NOVAK, MD  tiotropium (SPIRIVA ) 18 MCG inhalation capsule Place 18 mcg into inhaler and inhale daily.    [provider]  traZODone (DESYREL) 50 MG tablet Take 50 mg by mouth at bedtime. Patient not taking: Reported on 11/25/2022    [provider]  Vitamin D, Ergocalciferol, (DRISDOL) 1.25 MG (50000 UNIT) CAPS capsule Take 50,000 Units by mouth once a week. 10/03/22   [provider]    Physical Exam: Vitals:   09/10/23 0839 09/10/23 0840  BP:  (!) 171/78  Pulse:  (!) 102  Resp:  (!) 21  Temp:  98 F (36.7 C)  TempSrc:  Oral  SpO2: 96% 100%    Constitutional: NAD, calm, comfortable Vitals:   09/10/23 0839 09/10/23 0840  BP:  (!) 171/78  Pulse:   (!) 102  Resp:  (!) 21  Temp:  98 F (36.7 C)  TempSrc:  Oral  SpO2: 96% 100%   Eyes: PERRL, lids and conjunctivae normal ENMT: Mucous membranes are moist. Posterior pharynx clear of any exudate or lesions.Normal dentition.  Neck: normal, supple, no masses, no thyromegaly Respiratory: Diminished breathing sound bilaterally, diffused wheezing, no crackles.  Increasing respiratory effort. No accessory muscle use.  Cardiovascular: Regular rate and rhythm, no murmurs / rubs / gallops. No extremity edema. 2+ pedal pulses. No carotid bruits.  Abdomen: no tenderness, no masses palpated. No hepatosplenomegaly. Bowel sounds positive.  Musculoskeletal: no clubbing / cyanosis. No joint deformity upper and lower extremities. Good ROM, no contractures. Normal muscle tone.  Skin: no rashes, lesions, ulcers. No induration Neurologic: CN 2-12 grossly intact. Sensation intact, DTR normal. Strength 5/5 in all 4.  Psychiatric: Normal judgment and insight. Alert and oriented x 3. Normal mood.  Labs on Admission: I have personally reviewed following labs and imaging studies  CBC: Recent Labs  Lab 09/10/23 0841  WBC 18.1*  NEUTROABS 12.8*  HGB 13.1  HCT 41.3  MCV 88.4  PLT 445*   Basic Metabolic Panel: Recent Labs  Lab 09/10/23 0841  NA 142  K 3.6  CL 103  CO2 33*  GLUCOSE 108*  BUN 11  CREATININE 0.58  CALCIUM  9.2   GFR: CrCl cannot be calculated (Unknown ideal weight.). Liver Function Tests: Recent Labs  Lab 09/10/23 0841  AST 20  ALT 18  ALKPHOS 72  BILITOT <0.2  PROT 7.3  ALBUMIN 3.8   No results for input(s): LIPASE, AMYLASE in the last 168 hours. No results for input(s): AMMONIA in the last 168 hours. Coagulation Profile: No results for input(s): INR, PROTIME in the last 168 hours. Cardiac Enzymes: No results for input(s): CKTOTAL, CKMB, CKMBINDEX, TROPONINI in the last 168 hours. BNP (last 3 results) No results for input(s): PROBNP in the last  8760 hours. HbA1C: No results for input(s): HGBA1C in the last 72 hours. CBG: No results for input(s): GLUCAP in the last 168 hours. Lipid Profile: No results for input(s): CHOL, HDL, LDLCALC, TRIG, CHOLHDL, LDLDIRECT in the last 72 hours. Thyroid  Function Tests: No results for input(s): TSH, T4TOTAL, FREET4, T3FREE, THYROIDAB in the last 72 hours. Anemia Panel: No results for input(s): VITAMINB12, FOLATE, FERRITIN, TIBC, IRON, RETICCTPCT in the last 72 hours. Urine analysis:    Component Value Date/Time   COLORURINE YELLOW (A) 11/26/2022 1139   APPEARANCEUR CLEAR (A) 11/26/2022 1139   LABSPEC 1.023 11/26/2022 1139   PHURINE 5.0 11/26/2022 1139   GLUCOSEU NEGATIVE 11/26/2022 1139   HGBUR NEGATIVE 11/26/2022 1139   BILIRUBINUR NEGATIVE 11/26/2022 1139   KETONESUR NEGATIVE 11/26/2022 1139   PROTEINUR NEGATIVE 11/26/2022 1139   NITRITE NEGATIVE 11/26/2022 1139   LEUKOCYTESUR NEGATIVE 11/26/2022 1139    Radiological Exams on Admission: DG Chest 2 View Result Date: 09/10/2023 CLINICAL DATA:  Shortness of breath. EXAM: CHEST - 2 VIEW COMPARISON:  08/23/2023. FINDINGS: The heart size and mediastinal contours are unchanged. Emphysema. No focal consolidation, pleural effusion, or pneumothorax. No acute osseous abnormality. IMPRESSION: 1. No acute cardiopulmonary findings. 2. Emphysema. Electronically Signed   By: Harrietta Sherry M.D.   On: 09/10/2023 09:11    EKG: Independently reviewed.  Sinus tachycardia, no acute ST changes.  Assessment/Plan Principal Problem:   COPD exacerbation (HCC) Active Problems:   Acute exacerbation of chronic obstructive pulmonary disease (COPD) (HCC)  (please populate well all problems here in Problem List. (For example, if patient is on BP meds at home and you resume or decide to hold them, it is a problem that needs to be her. Same for CAD, COPD, HLD and so on)  Acute hypoxic respiratory failure Acute COPD  exacerbation - Continue IV Solu-Medrol  bridging for prednisone  - Pulmicort  - Spiriva  - Continue DuoNebs every 4 hours and as needed albuterol  - Incentive spirometry  SIRS -With leukocytosis and tachycardia and tachypneic, both appear to be more chronic especially associated with past episode of COPD exacerbation. - Likely secondary to COPD exacerbation - Clinically low suspicion for sepsis, lactic acid and procalcitonin level pending.  Hold off antibiotics  HTN - Stable, continue home BP meds  Total time spent on patient care 55 minutes DVT prophylaxis: Lovenox  Code Status: Full code Family Communication: None at bedside Disposition Plan: Expect less than 2 midnight hospital stay Consults called: None Admission status: MedSurg observation  Cort ONEIDA Mana MD [facility] Pager [phone]  09/10/2023, 11:17 AM

## 2023-09-10 NOTE — ED Provider Notes (Signed)
 [facility] Provider Note    Event Date/Time   First MD Initiated Contact with Patient 09/10/23 0831     (approximate)   History   Shortness of Breath   HPI  Deborah Montgomery is a 73 y.o. female with history of COPD who comes in with worsening shortness of breath.  Patient was just admitted to the hospital on 08/23/2023 and admitted till the fourth.  She had a CT scan that did not show pulmonary embolism but did show concerns for bronchitis and reactive airway disease.  She reports that she has been on steroids that were just stopped yesterday but I reviewed the discharge summary looks like it was only for 4 days but she would then have ended it on 8/8.  She has multiple COPD exacerbation admissions previously  She reportedly has been using her breathing treatments but requiring them much more frequently.  That she states that she continues to have a cough.  She states that she called EMS around 3 but then after her breathing treatment was complete she felt improved and so she told them she did not want to be transported into the hospital.  However then she called again this morning as the breathing treatment was not working and did request transport.  Patient had diffuse wheezing noted and was given 2 DuoNebs and 125 of Solu-Medrol .   Physical Exam   Triage Vital Signs: ED Triage Vitals  Encounter Vitals Group     BP 09/10/23 0840 (!) 171/78     Girls Systolic BP Percentile --      Girls Diastolic BP Percentile --      Boys Systolic BP Percentile --      Boys Diastolic BP Percentile --      Pulse Rate 09/10/23 0840 (!) 102     Resp 09/10/23 0840 (!) 21     Temp 09/10/23 0840 98 F (36.7 C)     Temp Source 09/10/23 0840 Oral     SpO2 09/10/23 0839 96 %     Weight --      Height --      Head Circumference --      Peak Flow --      Pain Score 09/10/23 0841 0     Pain Loc --      Pain Education --      Exclude from Growth Chart --     Most recent  vital signs: Vitals:   09/10/23 0839 09/10/23 0840  BP:  (!) 171/78  Pulse:  (!) 102  Resp:  (!) 21  Temp:  98 F (36.7 C)  SpO2: 96% 100%     General: Awake, no distress.  CV:  Good peripheral perfusion.  Resp:  Normal effort.  Diffuse wheezing noted bilaterally Abd:  No distention.  Soft and nontender  Other:  No swelling legs.  No calf tenderness   ED Results / Procedures / Treatments   Labs (all labs ordered are listed, but only abnormal results are displayed) Labs Reviewed  CBC WITH DIFFERENTIAL/PLATELET - Abnormal; Notable for the following components:      Result Value   WBC 18.1 (*)    Platelets 445 (*)    Neutro Abs 12.8 (*)    Eosinophils Absolute 0.7 (*)    Abs Immature Granulocytes 0.08 (*)    All other components within normal limits  RESP PANEL BY RT-PCR (RSV, FLU A&B, COVID)  [id number]  COMPREHENSIVE METABOLIC PANEL WITH GFR  BRAIN  NATRIURETIC PEPTIDE  TROPONIN I (HIGH SENSITIVITY)     EKG  My interpretation of EKG:  Sinus tachycardia rate of 105 without any ST elevation or T wave inversions, normal intervals  RADIOLOGY I have reviewed the xray personally and interpreted no evidence of any pneumonia but does have emphysema   PROCEDURES:  Critical Care performed: No  .1-3 Lead EKG Interpretation  Performed by: Ernest Ronal BRAVO, MD Authorized by: Ernest Ronal BRAVO, MD     Interpretation: abnormal     ECG rate:  100   ECG rate assessment: tachycardic     Rhythm: sinus tachycardia     Ectopy: none     Conduction: normal   .Critical Care  Performed by: Ernest Ronal BRAVO, MD Authorized by: Ernest Ronal BRAVO, MD   Critical care provider statement:    Critical care time (minutes):  30   Critical care was necessary to treat or prevent imminent or life-threatening deterioration of the following conditions:  Sepsis   Critical care was time spent personally by me on the following activities:  Development of treatment plan with patient or surrogate, discussions  with consultants, evaluation of patient's response to treatment, examination of patient, ordering and review of laboratory studies, ordering and review of radiographic studies, ordering and performing treatments and interventions, pulse oximetry, re-evaluation of patient's condition and review of old charts    MEDICATIONS ORDERED IN ED: Medications  cefTRIAXone  (ROCEPHIN ) 2 g in sodium chloride  0.9 % 100 mL IVPB (has no administration in time range)  doxycycline  (VIBRA -TABS) tablet 100 mg (has no administration in time range)  sodium chloride  0.9 % bolus 1,000 mL (has no administration in time range)     IMPRESSION / MDM / ASSESSMENT AND PLAN / ED COURSE  I reviewed the triage vital signs and the nursing notes.   Patient's presentation is most consistent with acute presentation with potential threat to life or bodily function.   Patient comes in with shortness of breath examination concerning for diffuse wheezing.  Patient already given 2 DuoNebs and Solu-Medrol  given another DuoNeb.  She is not hypoxic this time which is reassuring.  Blood work ordered evaluate for ACS, CHF, chest x-ray to evaluate for pneumonia, COVID testing.  CBC shows elevated white count but it appears that she has had this previously and she has been on steroids recently so could just be related to that.  She does report a cough.  Reports symptoms were initially improved and she attempted to ambulate and she started to feel worse again with the shortness of breath.  She has diffuse wheezing noted although no significant increased work of breathing.  Although her bicarb is elevated I will add on VBG.  Discussed with patient about going home versus admission.  Patient reports preference for admission given continued worsening symptoms already been on steroids.  I did review and discussed with her and Dr. Theotis did put her on a 10-day dose of 10 mg of steroids but she continued to have productive cough.  They never put  her on any antibiotics so we can trial some antibiotics in case this is just like a post bronchitis pneumonia given she is not improving.  Does meet sepsis criteria blood cultures, lactate ordered.  Small amount of fluid ordered as I do not want to fluid overload patient.  Will discuss hospital team for admission  The patient is on the cardiac monitor to evaluate for evidence of arrhythmia and/or significant heart rate changes.  FINAL CLINICAL IMPRESSION(S) / ED DIAGNOSES   Final diagnoses:  COPD exacerbation (HCC)  Sepsis, due to unspecified organism, unspecified whether acute organ dysfunction present [facility])     Rx / DC Orders   ED Discharge Orders     None        Note:  This document was prepared using Dragon voice recognition software and may include unintentional dictation errors.   Ernest Ronal BRAVO, MD 09/10/23 1005

## 2023-09-10 NOTE — ED Triage Notes (Signed)
 Pt arrived via [facility] from home with c/o breathing difficulties that started this morning. Pt just finished up a round of abx for bronchitis and reports not feeling better. Pt has a hx of COPD and asthma. Pt took 1 does of albuterol  before EMS arrival, EMS gave 2 duonebs and 125mg  Solumedrol en route to ED. Pt did not take her home meds this morning. Pt is A&Ox4 during triage.

## 2023-09-10 NOTE — Plan of Care (Signed)
  Problem: Clinical Measurements: Goal: Will remain free from infection Outcome: Progressing   Problem: Activity: Goal: Risk for activity intolerance will decrease Outcome: Progressing   Problem: Nutrition: Goal: Adequate nutrition will be maintained Outcome: Progressing   Problem: Coping: Goal: Level of anxiety will decrease Outcome: Progressing   Problem: Elimination: Goal: Will not experience complications related to urinary retention Outcome: Progressing   Problem: Pain Managment: Goal: General experience of comfort will improve and/or be controlled Outcome: Progressing   Problem: Safety: Goal: Ability to remain free from injury will improve Outcome: Progressing   Problem: Skin Integrity: Goal: Risk for impaired skin integrity will decrease Outcome: Progressing

## 2023-09-10 NOTE — ED Notes (Signed)
 Pt pressed call bell with reports that she felt sob. O2 sats 99% on RA. Pt wheezing, cough noted. Pt was placed on 2L [place] and primary RN was notified.

## 2023-09-10 NOTE — ED Notes (Signed)
Pt assisted  to the toilet.

## 2023-09-11 DIAGNOSIS — E785 Hyperlipidemia, unspecified: Secondary | ICD-10-CM | POA: Diagnosis present

## 2023-09-11 DIAGNOSIS — I10 Essential (primary) hypertension: Secondary | ICD-10-CM | POA: Diagnosis present

## 2023-09-11 DIAGNOSIS — Z87891 Personal history of nicotine dependence: Secondary | ICD-10-CM | POA: Diagnosis not present

## 2023-09-11 DIAGNOSIS — J9601 Acute respiratory failure with hypoxia: Secondary | ICD-10-CM | POA: Diagnosis present

## 2023-09-11 DIAGNOSIS — Z7982 Long term (current) use of aspirin: Secondary | ICD-10-CM | POA: Diagnosis not present

## 2023-09-11 DIAGNOSIS — K219 Gastro-esophageal reflux disease without esophagitis: Secondary | ICD-10-CM | POA: Diagnosis present

## 2023-09-11 DIAGNOSIS — Z801 Family history of malignant neoplasm of trachea, bronchus and lung: Secondary | ICD-10-CM | POA: Diagnosis not present

## 2023-09-11 DIAGNOSIS — Z803 Family history of malignant neoplasm of breast: Secondary | ICD-10-CM | POA: Diagnosis not present

## 2023-09-11 DIAGNOSIS — Z79899 Other long term (current) drug therapy: Secondary | ICD-10-CM | POA: Diagnosis not present

## 2023-09-11 DIAGNOSIS — Z90710 Acquired absence of both cervix and uterus: Secondary | ICD-10-CM | POA: Diagnosis not present

## 2023-09-11 DIAGNOSIS — Z1152 Encounter for screening for COVID-19: Secondary | ICD-10-CM | POA: Diagnosis not present

## 2023-09-11 DIAGNOSIS — J441 Chronic obstructive pulmonary disease with (acute) exacerbation: Secondary | ICD-10-CM | POA: Diagnosis present

## 2023-09-11 LAB — BASIC METABOLIC PANEL WITH GFR
Anion gap: 12 (ref 5–15)
BUN: 18 mg/dL (ref 8–23)
CO2: 23 mmol/L (ref 22–32)
Calcium: 9.2 mg/dL (ref 8.9–10.3)
Chloride: 101 mmol/L (ref 98–111)
Creatinine, Ser: 0.62 mg/dL (ref 0.44–1.00)
GFR, Estimated: >60 mL/min (ref >60)
Glucose, Bld: 124 mg/dL — ABNORMAL HIGH (ref 70–99)
Potassium: 4 mmol/L (ref 3.5–5.1)
Sodium: 136 mmol/L (ref 135–145)

## 2023-09-11 MED ORDER — BUDESONIDE 0.5 MG/2ML IN SUSP
0.5000 mg | Freq: Two times a day (BID) | RESPIRATORY_TRACT | Status: DC
  Administered 2023-09-11 – 2023-09-12 (×2): 0.5 mg via RESPIRATORY_TRACT
  Filled 2023-09-11 (×2): qty 2

## 2023-09-11 MED ORDER — FLUTICASONE FUROATE-VILANTEROL 100-25 MCG/ACT IN AEPB
1.0000 | INHALATION_SPRAY | Freq: Every day | RESPIRATORY_TRACT | Status: DC
  Administered 2023-09-11 – 2023-09-12 (×2): 1 via RESPIRATORY_TRACT
  Filled 2023-09-11: qty 28

## 2023-09-11 NOTE — Plan of Care (Signed)
   Problem: Education: Goal: Knowledge of General Education information will improve Description Including pain rating scale, medication(s)/side effects and non-pharmacologic comfort measures Outcome: Progressing   Problem: Clinical Measurements: Goal: Diagnostic test results will improve Outcome: Progressing Goal: Respiratory complications will improve Outcome: Progressing

## 2023-09-11 NOTE — Care Management Obs Status (Signed)
 MEDICARE OBSERVATION STATUS NOTIFICATION   Patient Details  Name: Deborah Montgomery MRN: [id number] Date of Birth: [birth year]   Medicare Observation Status Notification Given:  Chaney BRANDY CHRISTIANE LELON, CMA 09/11/2023, 10:30 AM

## 2023-09-11 NOTE — Progress Notes (Signed)
 PROGRESS NOTE    Deborah Montgomery  FMW:[id number] DOB: 06/05/1950 DOA: 09/10/2023 PCP: Harvey Gaetana CROME, NP   Brief Narrative:  HPI: Deborah Montgomery is a 73 y.o. female with medical history significant of COPD Gold stage II, HTN, HLD, presented with worsening of cough wheezing shortness of breath.   Symptoms started 2 days ago, patient started develop dry cough and wheezing, associated with worsening of exertional dyspnea.  Last night symptoms became worse, could not sleep because of severe shortness of breath and decided to come to ED.  Patient was just recently hospitalized 2 weeks ago for similar presentation, workup including a CTA negative for PE showed COPD exacerbation patient was treated with IV Solu-Medrol  and discharged home.  Her symptoms initially improved significantly until 2 days ago.  She denied any current smoking or secondhand smoking, she said that she has been always  allergic to something and plan to see a allergist in [place] area   ED Course: Afebrile, tachycardia tachypneic blood pressure 170/70 O2 saturation 96% on room air.  Chest x-ray negative for acute findings, patient became tachypneic and tachycardia with short ambulation after Solu-Medrol  treatment and albuterol  treatment.  Blood work showed WBC 18.1 hemoglobin 13 BUN 11 creatinine 0.8 bicarb 33.  Assessment & Plan:   Principal Problem:   COPD exacerbation (HCC) Active Problems:   Acute exacerbation of chronic obstructive pulmonary disease (COPD) (HCC)  Acute hypoxic respiratory failure secondary to acute COPD exacerbation: Patient states that she is feeling better, she is very eager to go home.  However during my encounter, patient had a long bout of cough, she appeared dyspneic and she was tachycardic when when she was just talking to me.  She has diminished breath sounds but no wheezes.  Based on this, I do not think she is ready for discharge yet, I recommended staying overnight.  Continue current  management of steroids, bronchodilators and antibiotics.  Patient not happy as she is eager to go home but she is in agreement.  Nurse was at the bedside.  Essential hypertension: Continue current medications, blood pressure stable.  DVT prophylaxis: enoxaparin  (LOVENOX ) injection 40 mg Start: 09/10/23 2200   Code Status: Full Code  Family Communication: Daughter present at bedside.  Plan of care discussed with patient in length and he/she verbalized understanding and agreed with it.  Status is: Observation The patient will require care spanning > 2 midnights and should be moved to inpatient because: Still not stable for discharge yet.   Estimated body mass index is 32.51 kg/m as calculated from the following:   Height as of 08/24/23: 5' 4 (1.626 m).   Weight as of 08/24/23: 85.9 kg.    Nutritional Assessment: There is no height or weight on file to calculate BMI.. Seen by dietician.  I agree with the assessment and plan as outlined below: Nutrition Status:        . Skin Assessment: I have examined the patient's skin and I agree with the wound assessment as performed by the wound care RN as outlined below:    Consultants:  None  Procedures:  None none  Antimicrobials:  Anti-infectives (From admission, onward)    Start     Dose/Rate Route Frequency Ordered Stop   09/10/23 1000  cefTRIAXone  (ROCEPHIN ) 2 g in sodium chloride  0.9 % 100 mL IVPB        2 g 200 mL/hr over 30 Minutes Intravenous  Once 09/10/23 0958 09/10/23 1154   09/10/23 1000  doxycycline  (VIBRA -TABS)  tablet 100 mg        100 mg Oral  Once 09/10/23 9041 09/10/23 1042         Subjective: Patient seen and examined, daughter at the bedside, nurse at the bedside as well.  Patient states that she is feeling better.  She is eager to go home.  However she was tachycardic with heart rate of 110 even when she was talking and before she started coughing.  I informed her that due to her hospitalization for the COPD  exacerbation just 2 weeks ago and her being tachypneic and tachycardic, she remains at high risk of readmission and thus I recommend staying overnight.  Objective: Vitals:   09/11/23 0303 09/11/23 0436 09/11/23 0924 09/11/23 0927  BP: (!) 151/63  127/66 (!) 159/77  Pulse: (!) 104 (!) 101 (!) 112 (!) 111  Resp: 20 19    Temp: 97.7 F (36.5 C)  99.3 F (37.4 C) 98.2 F (36.8 C)  TempSrc: Oral  Oral Oral  SpO2: 100% 96% 100% 99%   No intake or output data in the 24 hours ending 09/11/23 0947 There were no vitals filed for this visit.  Examination:  General exam: Appears calm and comfortable  Respiratory system: Diminished breath sounds, no wheezes. Respiratory effort normal.  Tachypneic Cardiovascular system: S1 & S2 heard, sinus tachycardia. No JVD, murmurs, rubs, gallops or clicks. No pedal edema. Gastrointestinal system: Abdomen is nondistended, soft and nontender. No organomegaly or masses felt. Normal bowel sounds heard. Central nervous system: Alert and oriented. No focal neurological deficits. Extremities: Symmetric 5 x 5 power. Skin: No rashes, lesions or ulcers Psychiatry: Judgement and insight appear normal. Mood & affect appropriate.    Data Reviewed: I have personally reviewed following labs and imaging studies  CBC: Recent Labs  Lab 09/10/23 0841  WBC 18.1*  NEUTROABS 12.8*  HGB 13.1  HCT 41.3  MCV 88.4  PLT 445*   Basic Metabolic Panel: Recent Labs  Lab 09/10/23 0841  NA 142  K 3.6  CL 103  CO2 33*  GLUCOSE 108*  BUN 11  CREATININE 0.58  CALCIUM  9.2   GFR: CrCl cannot be calculated (Unknown ideal weight.). Liver Function Tests: Recent Labs  Lab 09/10/23 0841  AST 20  ALT 18  ALKPHOS 72  BILITOT <0.2  PROT 7.3  ALBUMIN 3.8   No results for input(s): LIPASE, AMYLASE in the last 168 hours. No results for input(s): AMMONIA in the last 168 hours. Coagulation Profile: No results for input(s): INR, PROTIME in the last 168  hours. Cardiac Enzymes: No results for input(s): CKTOTAL, CKMB, CKMBINDEX, TROPONINI in the last 168 hours. BNP (last 3 results) No results for input(s): PROBNP in the last 8760 hours. HbA1C: No results for input(s): HGBA1C in the last 72 hours. CBG: No results for input(s): GLUCAP in the last 168 hours. Lipid Profile: No results for input(s): CHOL, HDL, LDLCALC, TRIG, CHOLHDL, LDLDIRECT in the last 72 hours. Thyroid  Function Tests: No results for input(s): TSH, T4TOTAL, FREET4, T3FREE, THYROIDAB in the last 72 hours. Anemia Panel: No results for input(s): VITAMINB12, FOLATE, FERRITIN, TIBC, IRON, RETICCTPCT in the last 72 hours. Sepsis Labs: Recent Labs  Lab 09/10/23 0841 09/10/23 1035  PROCALCITON <0.10  --   LATICACIDVEN  --  1.2    Recent Results (from the past 240 hours)  Resp panel by RT-PCR (RSV, Flu A&B, Covid) Anterior Nasal Swab     Status: None   Collection Time: 09/10/23 10:01 AM   Specimen:  Anterior Nasal Swab  Result Value Ref Range Status   SARS Coronavirus 2 by RT PCR NEGATIVE NEGATIVE Final    Comment: (NOTE) SARS-CoV-2 target nucleic acids are NOT DETECTED.  The SARS-CoV-2 RNA is generally detectable in upper respiratory specimens during the acute phase of infection. The lowest concentration of SARS-CoV-2 viral copies this assay can detect is 138 copies/mL. A negative result does not preclude SARS-Cov-2 infection and should not be used as the sole basis for treatment or other patient management decisions. A negative result may occur with  improper specimen collection/handling, submission of specimen other than nasopharyngeal swab, presence of viral mutation(s) within the areas targeted by this assay, and inadequate number of viral copies(<138 copies/mL). A negative result must be combined with clinical observations, patient history, and epidemiological information. The expected result is Negative.  Fact  Sheet for Patients:  BloggerCourse.com  Fact Sheet for Healthcare Providers:  SeriousBroker.it  This test is no t yet approved or cleared by the [place]  FDA and  has been authorized for detection and/or diagnosis of SARS-CoV-2 by FDA under an Emergency Use Authorization (EUA). This EUA will remain  in effect (meaning this test can be used) for the duration of the COVID-19 declaration under Section 564(b)(1) of the Act, 21 U.S.C.section 360bbb-3(b)(1), unless the authorization is terminated  or revoked sooner.       Influenza A by PCR NEGATIVE NEGATIVE Final   Influenza B by PCR NEGATIVE NEGATIVE Final    Comment: (NOTE) The Xpert Xpress SARS-CoV-2/FLU/RSV plus assay is intended as an aid in the diagnosis of influenza from Nasopharyngeal swab specimens and should not be used as a sole basis for treatment. Nasal washings and aspirates are unacceptable for Xpert Xpress SARS-CoV-2/FLU/RSV testing.  Fact Sheet for Patients: BloggerCourse.com  Fact Sheet for Healthcare Providers: SeriousBroker.it  This test is not yet approved or cleared by the [place]  FDA and has been authorized for detection and/or diagnosis of SARS-CoV-2 by FDA under an Emergency Use Authorization (EUA). This EUA will remain in effect (meaning this test can be used) for the duration of the COVID-19 declaration under Section 564(b)(1) of the Act, 21 U.S.C. section 360bbb-3(b)(1), unless the authorization is terminated or revoked.     Resp Syncytial Virus by PCR NEGATIVE NEGATIVE Final    Comment: (NOTE) Fact Sheet for Patients: BloggerCourse.com  Fact Sheet for Healthcare Providers: SeriousBroker.it  This test is not yet approved or cleared by the [place]  FDA and has been authorized for detection and/or diagnosis of SARS-CoV-2 by FDA under an  Emergency Use Authorization (EUA). This EUA will remain in effect (meaning this test can be used) for the duration of the COVID-19 declaration under Section 564(b)(1) of the Act, 21 U.S.C. section 360bbb-3(b)(1), unless the authorization is terminated or revoked.  Performed at [facility], [address] [place], [place] [address]      Radiology Studies: DG Chest 2 View Result Date: 09/10/2023 CLINICAL DATA:  Shortness of breath. EXAM: CHEST - 2 VIEW COMPARISON:  08/23/2023. FINDINGS: The heart size and mediastinal contours are unchanged. Emphysema. No focal consolidation, pleural effusion, or pneumothorax. No acute osseous abnormality. IMPRESSION: 1. No acute cardiopulmonary findings. 2. Emphysema. Electronically Signed   By: Harrietta Sherry M.D.   On: 09/10/2023 09:11    Scheduled Meds:  albuterol   2.5 mg Nebulization TID   amLODipine   10 mg Oral Daily   budesonide  (PULMICORT ) nebulizer solution  0.5 mg Nebulization Q12H   enoxaparin  (LOVENOX ) injection  40 mg  Subcutaneous Q24H   ezetimibe   10 mg Oral Daily   latanoprost   1 drop Both Eyes QHS   losartan   100 mg Oral Daily   montelukast   10 mg Oral Daily   predniSONE   40 mg Oral Q breakfast   umeclidinium bromide   1 puff Inhalation Daily   Continuous Infusions:   LOS: 0 days   Fredia Skeeter, MD [facility]  09/11/2023, 9:47 AM   *Please note that this is a verbal dictation therefore any spelling or grammatical errors are due to the Dragon Medical One system interpretation.  Please page via Amion and do not message via secure chat for urgent patient care matters. Secure chat can be used for non urgent patient care matters.  How to contact the [facility] Attending or Consulting provider 7A - 7P or covering provider during after hours 7P -7A, for this patient?  Check the care team in [facility] and look for a) attending/consulting [facility] provider listed and b) the [facility] team listed. Page or secure chat 7A-7P. Log into  [email or web address] and use [facility] universal password to access. If you do not have the password, please contact the hospital operator. Locate the [facility] provider you are looking for under [facility] and page to a number that you can be directly reached. If you still have difficulty reaching the provider, please page the [facility] (Director on Call) for the Hospitalists listed on amion for assistance.

## 2023-09-12 ENCOUNTER — Other Ambulatory Visit: Payer: Self-pay

## 2023-09-12 DIAGNOSIS — J441 Chronic obstructive pulmonary disease with (acute) exacerbation: Secondary | ICD-10-CM | POA: Diagnosis not present

## 2023-09-12 MED ORDER — ALBUTEROL SULFATE (2.5 MG/3ML) 0.083% IN NEBU: 2.5000 mg | INHALATION_SOLUTION | Freq: Two times a day (BID) | RESPIRATORY_TRACT | Status: DC

## 2023-09-12 MED ORDER — GUAIFENESIN-DM 100-10 MG/5ML PO SYRP
5.0000 mL | ORAL_SOLUTION | ORAL | Status: DC | PRN
  Administered 2023-09-12: 5 mL via ORAL
  Filled 2023-09-12: qty 10

## 2023-09-12 MED ORDER — PREDNISONE 20 MG PO TABS
40.0000 mg | ORAL_TABLET | Freq: Every day | ORAL | 0 refills | Status: AC
  Filled 2023-09-12: qty 6, 3d supply, fill #0

## 2023-09-12 MED ORDER — AZITHROMYCIN 250 MG PO TABS
250.0000 mg | ORAL_TABLET | Freq: Every day | ORAL | 0 refills | Status: AC
  Filled 2023-09-12: qty 3, 3d supply, fill #0

## 2023-09-12 NOTE — Progress Notes (Signed)
 Reviewed discharge instructions with patient. Patient acknowledged understanding. Patient received meds to bedside. Patient discharged with personal belongings. Patient wheeled out by staff. Patient transported home via family vehicle. No distress noted in patient.

## 2023-09-12 NOTE — Plan of Care (Signed)
 AOx4. VSS. Patient's HR 80s during the night, decreased from previous shift. Patient was on room air except for feeling dyspnea walking to the bathroom and used 1L BNC  until breathing eased. No c/o pain. Patient had increased coughing this morning- Robitussin DM 1x. No acute events during the night. Safety measures in place throughout the night.   Problem: Education: Goal: Knowledge of General Education information will improve Description: Including pain rating scale, medication(s)/side effects and non-pharmacologic comfort measures Outcome: Progressing   Problem: Activity: Goal: Risk for activity intolerance will decrease Outcome: Progressing   Problem: Safety: Goal: Ability to remain free from injury will improve Outcome: Progressing

## 2023-09-12 NOTE — Plan of Care (Signed)

## 2023-09-12 NOTE — Discharge Summary (Signed)
 Physician Discharge Summary  Deborah Montgomery FMW:[id number] DOB: Oct 19, 1950 DOA: 09/10/2023  PCP: Harvey Gaetana CROME, NP  Admit date: 09/10/2023 Discharge date: 09/12/2023 30 Day Unplanned Readmission Risk Score    Flowsheet Row ED to Hosp-Admission (Current) from 09/10/2023 in [facility] (1A)  30 Day Unplanned Readmission Risk Score (%) 11.55 Filed at 09/12/2023 0801    This score is the patient's risk of an unplanned readmission within 30 days of being discharged (0 -100%). The score is based on dignosis, age, lab data, medications, orders, and past utilization.   Low:  0-14.9   Medium: 15-21.9   High: 22-29.9   Extreme: 30 and above          Admitted From: Home Disposition: Home  Recommendations for Outpatient Follow-up:  Follow up with PCP in 1-2 weeks Please obtain BMP/CBC in one week Please follow up with your PCP on the following pending results: Unresulted Labs (From admission, onward)     Start     Ordered   09/10/23 2300  Respiratory (~20 pathogens) panel by PCR  (COPD / Pneumonia / Cellulitis / Lower Extremity Wound (Diabetic Foot Infection))  Once,   R       Comments: Can not be added    09/10/23 2258              Home Health: None Equipment/Devices: None  Discharge Condition: Stable CODE STATUS: Full code Diet recommendation:  Diet Order             Diet Heart Room service appropriate? Yes; Fluid consistency: Thin  Diet effective now                 Due to better understanding of the hospitalization, I have copied admitting hospitalist HPI and ED course as below.  HPI: TRICA USERY is a 73 y.o. female with medical history significant of COPD Gold stage II, HTN, HLD, presented with worsening of cough wheezing shortness of breath.   Symptoms started 2 days ago, patient started develop dry cough and wheezing, associated with worsening of exertional dyspnea.  Last night symptoms became worse, could not sleep because of  severe shortness of breath and decided to come to ED.  Patient was just recently hospitalized 2 weeks ago for similar presentation, workup including a CTA negative for PE showed COPD exacerbation patient was treated with IV Solu-Medrol  and discharged home.  Her symptoms initially improved significantly until 2 days ago.  She denied any current smoking or secondhand smoking, she said that she has been always  allergic to something and plan to see a allergist in [place] area   ED Course: Afebrile, tachycardia tachypneic blood pressure 170/70 O2 saturation 96% on room air.  Chest x-ray negative for acute findings, patient became tachypneic and tachycardia with short ambulation after Solu-Medrol  treatment and albuterol  treatment.  Blood work showed WBC 18.1 hemoglobin 13 BUN 11 creatinine 0.8 bicarb 33.  Subjective: Patient seen and examined, feels much better.  No shortness of breath or ankle complaint.  She wants to go home.  Brief/Interim Summary: Patient was admitted for Acute hypoxic respiratory failure secondary to acute COPD exacerbation: Started on steroids, bronchodilators and antibiotics.  Has been weaned to room air now.  No complaints.  Discharging on few more doses of antibiotics and steroids.   Essential hypertension: Continue current medications, blood pressure stable for most part.  Discharge plan was discussed with patient and/or family member and they verbalized understanding and agreed with it.  Discharge Diagnoses:  Principal Problem:   COPD exacerbation (HCC) Active Problems:   Acute exacerbation of chronic obstructive pulmonary disease (COPD) (HCC)    Discharge Instructions   Allergies as of 09/12/2023   No Known Allergies      Medication List     STOP taking these medications    aspirin  EC 81 MG tablet   diclofenac sodium 1 % Gel Commonly known as: VOLTAREN   nicotine  polacrilex 4 MG lozenge Commonly known as: Nicotine  Mini    pseudoephedrine-dextromethorphan -guaifenesin  30-10-100 MG/5ML solution Commonly known as: ROBITUSSIN-PE   rosuvastatin  20 MG tablet Commonly known as: CRESTOR    Symbicort  160-4.5 MCG/ACT inhaler Generic drug: budesonide -formoterol    traZODone 50 MG tablet Commonly known as: DESYREL       TAKE these medications    AeroChamber MV inhaler Use as instructed   albuterol  108 (90 Base) MCG/ACT inhaler Commonly known as: VENTOLIN  HFA Inhale 2 puffs into the lungs every 6 (six) hours as needed for wheezing or shortness of breath.   albuterol  (2.5 MG/3ML) 0.083% nebulizer solution Commonly known as: PROVENTIL  Take 3 mLs (2.5 mg total) by nebulization every 4 (four) hours as needed for wheezing or shortness of breath.   alendronate 70 MG tablet Commonly known as: FOSAMAX Take 70 mg by mouth once a week.   amLODipine  5 MG tablet Commonly known as: NORVASC  Take 10 mg by mouth daily.   azithromycin  250 MG tablet Commonly known as: Zithromax  Take 1 tablet (250 mg total) by mouth daily for 3 days.   Breztri Aerosphere 160-9-4.8 MCG/ACT Aero inhaler Generic drug: budesonide -glycopyrrolate-formoterol  Inhale 2 puffs into the lungs 2 (two) times daily.   busPIRone 5 MG tablet Commonly known as: BUSPAR Take 5 mg by mouth 2 (two) times daily.   Dulera  200-5 MCG/ACT Aero Generic drug: mometasone -formoterol  Inhale 2 puffs into the lungs 2 (two) times daily.   ezetimibe  10 MG tablet Commonly known as: ZETIA  Take 10 mg by mouth daily.   fluticasone  50 MCG/ACT nasal spray Commonly known as: FLONASE  Place 2 sprays into both nostrils daily as needed for rhinitis.   ibuprofen 800 MG tablet Commonly known as: ADVIL Take 800 mg by mouth every 6 (six) hours as needed.   latanoprost  0.005 % ophthalmic solution Commonly known as: XALATAN  Place 1 drop into both eyes at bedtime.   losartan  50 MG tablet Commonly known as: COZAAR  Take 100 mg by mouth daily.   montelukast  10 MG  tablet Commonly known as: SINGULAIR  Take 10 mg by mouth daily.   omeprazole 40 MG capsule Commonly known as: PRILOSEC Take 40 mg by mouth daily.   predniSONE  20 MG tablet Commonly known as: DELTASONE  Take 2 tablets (40 mg total) by mouth daily with breakfast for 3 days. Start taking on: September 13, 2023 What changed:  medication strength how much to take   tiotropium 18 MCG inhalation capsule Commonly known as: SPIRIVA  Place 18 mcg into inhaler and inhale daily.   Vitamin D (Ergocalciferol) 1.25 MG (50000 UNIT) Caps capsule Commonly known as: DRISDOL Take 50,000 Units by mouth once a week.        Follow-up Information     Fields, Gaetana CROME, NP. Schedule an appointment as soon as possible for a visit in 1 week(s).   Specialty: Family Medicine Why: Make an appointment for a 1 week follow-up Contact information: [address] [place] [address] [phone]                No Known  Allergies  Consultations: None   Procedures/Studies: DG Chest 2 View Result Date: 09/10/2023 CLINICAL DATA:  Shortness of breath. EXAM: CHEST - 2 VIEW COMPARISON:  08/23/2023. FINDINGS: The heart size and mediastinal contours are unchanged. Emphysema. No focal consolidation, pleural effusion, or pneumothorax. No acute osseous abnormality. IMPRESSION: 1. No acute cardiopulmonary findings. 2. Emphysema. Electronically Signed   By: Harrietta Sherry M.D.   On: 09/10/2023 09:11   CT Angio Chest PE W and/or Wo Contrast Result Date: 08/23/2023 CLINICAL DATA:  Respiratory distress, COPD EXAM: CT ANGIOGRAPHY CHEST WITH CONTRAST TECHNIQUE: Multidetector CT imaging of the chest was performed using the standard protocol during bolus administration of intravenous contrast. Multiplanar CT image reconstructions and MIPs were obtained to evaluate the vascular anatomy. RADIATION DOSE REDUCTION: This exam was performed according to the departmental dose-optimization program which includes automated  exposure control, adjustment of the mA and/or kV according to patient size and/or use of iterative reconstruction technique. CONTRAST:  75mL OMNIPAQUE  IOHEXOL  350 MG/ML SOLN COMPARISON:  08/23/2023, 05/16/2022 FINDINGS: Cardiovascular: This is a technically adequate evaluation of the pulmonary vasculature. No filling defects or pulmonary emboli. The heart is unremarkable without pericardial effusion. No evidence of thoracic aortic aneurysm or dissection. Stable atherosclerosis of the aorta and coronary vasculature. Mediastinum/Nodes: No pathologic adenopathy. Stable heterogeneous enlargement of the left lobe thyroid  with hypodense nodule measuring up to 3.5 cm. Trachea and esophagus are unremarkable. Lungs/Pleura: Stable centrilobular emphysema, upper lobe predominant. No acute airspace disease, effusion, or pneumothorax. Minimal retained secretions within the right mainstem bronchus. Bilateral lower lobe bronchial wall thickening consistent with bronchitis or reactive airway disease. Upper Abdomen: No acute abnormality. Musculoskeletal: No acute or destructive bony abnormalities. Reconstructed images demonstrate no additional findings. Review of the MIP images confirms the above findings. IMPRESSION: 1. No evidence of pulmonary embolus. 2. Bilateral lower lobe bronchial wall thickening, consistent with bronchitis or reactive airway disease. 3. Stable 3.5 cm left lobe thyroid  nodule. Nonemergent outpatient thyroid  ultrasound recommended if not previously performed. 4. Aortic Atherosclerosis (ICD10-I70.0) and Emphysema (ICD10-J43.9). Electronically Signed   By: Ozell Daring M.D.   On: 08/23/2023 23:58   DG Chest Port 1 View Result Date: 08/23/2023 EXAM: 1 VIEW XRAY OF THE CHEST 08/23/2023 10:53:42 PM COMPARISON: 08/11/2023 CLINICAL HISTORY: 141880 SOB (shortness of breath) [id number]. PER ER NOTE; PER ER NOTE from home via EMS for report of respiratory distress, has COPD. Similar visit the other day. Received 2  duonebs, 1 albuterol  and 125mg  of solumedrol. Patient reports breathing is much improved. FINDINGS: LUNGS AND PLEURA: Atelectasis or infiltrates in the bilateral lower lungs has increased compared to 08/11/23. No pleural effusion. No pneumothorax. HEART AND MEDIASTINUM: Stable cardiomediastinal silhouette. BONES AND SOFT TISSUES: No displaced rib fracture. IMPRESSION: 1. Increased atelectasis or infiltrates in the bilateral lower lungs compared to 08/11/23. Electronically signed by: Norman Gatlin MD 08/23/2023 11:00 PM EDT RP Workstation: [id number]     Discharge Exam: Vitals:   09/12/23 0706 09/12/23 0812  BP:  (!) 184/84  Pulse:  93  Resp:  18  Temp:  98.7 F (37.1 C)  SpO2: 94% 100%   Vitals:   09/12/23 0001 09/12/23 0257 09/12/23 0706 09/12/23 0812  BP: (!) 145/74 (!) 142/76  (!) 184/84  Pulse: 89 87  93  Resp: 18 18  18   Temp: 98.6 F (37 C) 98.4 F (36.9 C)  98.7 F (37.1 C)  TempSrc:      SpO2: 96% 97% 94% 100%    General: Pt is alert, awake, not  in acute distress Cardiovascular: RRR, S1/S2 +, no rubs, no gallops Respiratory: CTA bilaterally, no wheezing, no rhonchi Abdominal: Soft, NT, ND, bowel sounds + Extremities: no edema, no cyanosis    The results of significant diagnostics from this hospitalization (including imaging, microbiology, ancillary and laboratory) are listed below for reference.     Microbiology: Recent Results (from the past 240 hours)  Resp panel by RT-PCR (RSV, Flu A&B, Covid) Anterior Nasal Swab     Status: None   Collection Time: 09/10/23 10:01 AM   Specimen: Anterior Nasal Swab  Result Value Ref Range Status   SARS Coronavirus 2 by RT PCR NEGATIVE NEGATIVE Final    Comment: (NOTE) SARS-CoV-2 target nucleic acids are NOT DETECTED.  The SARS-CoV-2 RNA is generally detectable in upper respiratory specimens during the acute phase of infection. The lowest concentration of SARS-CoV-2 viral copies this assay can detect is 138 copies/mL. A  negative result does not preclude SARS-Cov-2 infection and should not be used as the sole basis for treatment or other patient management decisions. A negative result may occur with  improper specimen collection/handling, submission of specimen other than nasopharyngeal swab, presence of viral mutation(s) within the areas targeted by this assay, and inadequate number of viral copies(<138 copies/mL). A negative result must be combined with clinical observations, patient history, and epidemiological information. The expected result is Negative.  Fact Sheet for Patients:  BloggerCourse.com  Fact Sheet for Healthcare Providers:  SeriousBroker.it  This test is no t yet approved or cleared by the [place]  FDA and  has been authorized for detection and/or diagnosis of SARS-CoV-2 by FDA under an Emergency Use Authorization (EUA). This EUA will remain  in effect (meaning this test can be used) for the duration of the COVID-19 declaration under Section 564(b)(1) of the Act, 21 U.S.C.section 360bbb-3(b)(1), unless the authorization is terminated  or revoked sooner.       Influenza A by PCR NEGATIVE NEGATIVE Final   Influenza B by PCR NEGATIVE NEGATIVE Final    Comment: (NOTE) The Xpert Xpress SARS-CoV-2/FLU/RSV plus assay is intended as an aid in the diagnosis of influenza from Nasopharyngeal swab specimens and should not be used as a sole basis for treatment. Nasal washings and aspirates are unacceptable for Xpert Xpress SARS-CoV-2/FLU/RSV testing.  Fact Sheet for Patients: BloggerCourse.com  Fact Sheet for Healthcare Providers: SeriousBroker.it  This test is not yet approved or cleared by the [place]  FDA and has been authorized for detection and/or diagnosis of SARS-CoV-2 by FDA under an Emergency Use Authorization (EUA). This EUA will remain in effect (meaning this test can  be used) for the duration of the COVID-19 declaration under Section 564(b)(1) of the Act, 21 U.S.C. section 360bbb-3(b)(1), unless the authorization is terminated or revoked.     Resp Syncytial Virus by PCR NEGATIVE NEGATIVE Final    Comment: (NOTE) Fact Sheet for Patients: BloggerCourse.com  Fact Sheet for Healthcare Providers: SeriousBroker.it  This test is not yet approved or cleared by the [place]  FDA and has been authorized for detection and/or diagnosis of SARS-CoV-2 by FDA under an Emergency Use Authorization (EUA). This EUA will remain in effect (meaning this test can be used) for the duration of the COVID-19 declaration under Section 564(b)(1) of the Act, 21 U.S.C. section 360bbb-3(b)(1), unless the authorization is terminated or revoked.  Performed at [facility], [address] [place], [place] [address]   Blood culture (routine x 2)     Status: None (Preliminary result)   Collection  Time: 09/10/23 10:17 AM   Specimen: BLOOD RIGHT ARM  Result Value Ref Range Status   Specimen Description BLOOD RIGHT ARM  Final   Special Requests   Final    BOTTLES DRAWN AEROBIC AND ANAEROBIC Blood Culture adequate volume   Culture   Final    NO GROWTH 2 DAYS Performed at [facility], [address] [place], [place] [address]    Report Status PENDING  Incomplete  Blood culture (routine x 2)     Status: None (Preliminary result)   Collection Time: 09/10/23 10:17 AM   Specimen: BLOOD LEFT ARM  Result Value Ref Range Status   Specimen Description BLOOD LEFT ARM  Final   Special Requests   Final    BOTTLES DRAWN AEROBIC AND ANAEROBIC Blood Culture adequate volume   Culture   Final    NO GROWTH 2 DAYS Performed at [facility], [address], [place], [place] [address]    Report Status PENDING  Incomplete     Labs: BNP (last 3 results) Recent Labs    08/11/23 2121 09/10/23 0841  BNP  38.3 33.0   Basic Metabolic Panel: Recent Labs  Lab 09/10/23 0841 09/11/23 1025  NA 142 136  K 3.6 4.0  CL 103 101  CO2 33* 23  GLUCOSE 108* 124*  BUN 11 18  CREATININE 0.58 0.62  CALCIUM  9.2 9.2   Liver Function Tests: Recent Labs  Lab 09/10/23 0841  AST 20  ALT 18  ALKPHOS 72  BILITOT <0.2  PROT 7.3  ALBUMIN 3.8   No results for input(s): LIPASE, AMYLASE in the last 168 hours. No results for input(s): AMMONIA in the last 168 hours. CBC: Recent Labs  Lab 09/10/23 0841  WBC 18.1*  NEUTROABS 12.8*  HGB 13.1  HCT 41.3  MCV 88.4  PLT 445*   Cardiac Enzymes: No results for input(s): CKTOTAL, CKMB, CKMBINDEX, TROPONINI in the last 168 hours. BNP: Invalid input(s): POCBNP CBG: No results for input(s): GLUCAP in the last 168 hours. D-Dimer No results for input(s): DDIMER in the last 72 hours. Hgb A1c No results for input(s): HGBA1C in the last 72 hours. Lipid Profile No results for input(s): CHOL, HDL, LDLCALC, TRIG, CHOLHDL, LDLDIRECT in the last 72 hours. Thyroid  function studies No results for input(s): TSH, T4TOTAL, T3FREE, THYROIDAB in the last 72 hours.  Invalid input(s): FREET3 Anemia work up No results for input(s): VITAMINB12, FOLATE, FERRITIN, TIBC, IRON, RETICCTPCT in the last 72 hours. Urinalysis    Component Value Date/Time   COLORURINE YELLOW (A) 11/26/2022 1139   APPEARANCEUR CLEAR (A) 11/26/2022 1139   LABSPEC 1.023 11/26/2022 1139   PHURINE 5.0 11/26/2022 1139   GLUCOSEU NEGATIVE 11/26/2022 1139   HGBUR NEGATIVE 11/26/2022 1139   BILIRUBINUR NEGATIVE 11/26/2022 1139   KETONESUR NEGATIVE 11/26/2022 1139   PROTEINUR NEGATIVE 11/26/2022 1139   NITRITE NEGATIVE 11/26/2022 1139   LEUKOCYTESUR NEGATIVE 11/26/2022 1139   Sepsis Labs Recent Labs  Lab 09/10/23 0841  WBC 18.1*   Microbiology Recent Results (from the past 240 hours)  Resp panel by RT-PCR (RSV, Flu A&B, Covid)  Anterior Nasal Swab     Status: None   Collection Time: 09/10/23 10:01 AM   Specimen: Anterior Nasal Swab  Result Value Ref Range Status   SARS Coronavirus 2 by RT PCR NEGATIVE NEGATIVE Final    Comment: (NOTE) SARS-CoV-2 target nucleic acids are NOT DETECTED.  The SARS-CoV-2 RNA is generally detectable in upper respiratory specimens during the acute phase  of infection. The lowest concentration of SARS-CoV-2 viral copies this assay can detect is 138 copies/mL. A negative result does not preclude SARS-Cov-2 infection and should not be used as the sole basis for treatment or other patient management decisions. A negative result may occur with  improper specimen collection/handling, submission of specimen other than nasopharyngeal swab, presence of viral mutation(s) within the areas targeted by this assay, and inadequate number of viral copies(<138 copies/mL). A negative result must be combined with clinical observations, patient history, and epidemiological information. The expected result is Negative.  Fact Sheet for Patients:  BloggerCourse.com  Fact Sheet for Healthcare Providers:  SeriousBroker.it  This test is no t yet approved or cleared by the [place]  FDA and  has been authorized for detection and/or diagnosis of SARS-CoV-2 by FDA under an Emergency Use Authorization (EUA). This EUA will remain  in effect (meaning this test can be used) for the duration of the COVID-19 declaration under Section 564(b)(1) of the Act, 21 U.S.C.section 360bbb-3(b)(1), unless the authorization is terminated  or revoked sooner.       Influenza A by PCR NEGATIVE NEGATIVE Final   Influenza B by PCR NEGATIVE NEGATIVE Final    Comment: (NOTE) The Xpert Xpress SARS-CoV-2/FLU/RSV plus assay is intended as an aid in the diagnosis of influenza from Nasopharyngeal swab specimens and should not be used as a sole basis for treatment. Nasal washings  and aspirates are unacceptable for Xpert Xpress SARS-CoV-2/FLU/RSV testing.  Fact Sheet for Patients: BloggerCourse.com  Fact Sheet for Healthcare Providers: SeriousBroker.it  This test is not yet approved or cleared by the [place]  FDA and has been authorized for detection and/or diagnosis of SARS-CoV-2 by FDA under an Emergency Use Authorization (EUA). This EUA will remain in effect (meaning this test can be used) for the duration of the COVID-19 declaration under Section 564(b)(1) of the Act, 21 U.S.C. section 360bbb-3(b)(1), unless the authorization is terminated or revoked.     Resp Syncytial Virus by PCR NEGATIVE NEGATIVE Final    Comment: (NOTE) Fact Sheet for Patients: BloggerCourse.com  Fact Sheet for Healthcare Providers: SeriousBroker.it  This test is not yet approved or cleared by the [place]  FDA and has been authorized for detection and/or diagnosis of SARS-CoV-2 by FDA under an Emergency Use Authorization (EUA). This EUA will remain in effect (meaning this test can be used) for the duration of the COVID-19 declaration under Section 564(b)(1) of the Act, 21 U.S.C. section 360bbb-3(b)(1), unless the authorization is terminated or revoked.  Performed at [facility], [address] [place], [place] [address]   Blood culture (routine x 2)     Status: None (Preliminary result)   Collection Time: 09/10/23 10:17 AM   Specimen: BLOOD RIGHT ARM  Result Value Ref Range Status   Specimen Description BLOOD RIGHT ARM  Final   Special Requests   Final    BOTTLES DRAWN AEROBIC AND ANAEROBIC Blood Culture adequate volume   Culture   Final    NO GROWTH 2 DAYS Performed at [facility], [address] [place], [place] [address]    Report Status PENDING  Incomplete  Blood culture (routine x 2)     Status: None (Preliminary result)    Collection Time: 09/10/23 10:17 AM   Specimen: BLOOD LEFT ARM  Result Value Ref Range Status   Specimen Description BLOOD LEFT ARM  Final   Special Requests   Final    BOTTLES DRAWN AEROBIC AND ANAEROBIC Blood Culture adequate volume  Culture   Final    NO GROWTH 2 DAYS Performed at [facility], [address] [place], [place] [address]    Report Status PENDING  Incomplete    FURTHER DISCHARGE INSTRUCTIONS:   Get Medicines reviewed and adjusted: Please take all your medications with you for your next visit with your Primary MD   Laboratory/radiological data: Please request your Primary MD to go over all hospital tests and procedure/radiological results at the follow up, please ask your Primary MD to get all Hospital records sent to his/her office.   In some cases, they will be blood work, cultures and biopsy results pending at the time of your discharge. Please request that your primary care M.D. goes through all the records of your hospital data and follows up on these results.   Also Note the following: If you experience worsening of your admission symptoms, develop shortness of breath, life threatening emergency, suicidal or homicidal thoughts you must seek medical attention immediately by calling 911 or calling your MD immediately  if symptoms less severe.   You must read complete instructions/literature along with all the possible adverse reactions/side effects for all the Medicines you take and that have been prescribed to you. Take any new Medicines after you have completely understood and accpet all the possible adverse reactions/side effects.    patient was instructed, not to drive, operate heavy machinery, perform activities at heights, swimming or participation in water activities or provide baby-sitting services while on Pain, Sleep and Anxiety Medications; until their outpatient Physician has advised to do so again. Also recommended to not to take more than  prescribed Pain, Sleep and Anxiety Medications.  It is not advisable to combine anxiety, sleep and pain medications without talking with your primary care provider.     Wear Seat belts while driving.   Please note: You were cared for by a hospitalist during your hospital stay. Once you are discharged, your primary care physician will handle any further medical issues. Please note that NO REFILLS for any discharge medications will be authorized once you are discharged, as it is imperative that you return to your primary care physician (or establish a relationship with a primary care physician if you do not have one) for your post hospital discharge needs so that they can reassess your need for medications and monitor your lab values  Time coordinating discharge: Over 30 minutes  SIGNED:   Fredia Skeeter, MD  [facility] 09/12/2023, 10:53 AM *Please note that this is a verbal dictation therefore any spelling or grammatical errors are due to the Dragon Medical One system interpretation. If 7PM-7AM, please contact night-coverage [email or web address]

## 2023-09-15 LAB — CULTURE, BLOOD (ROUTINE X 2)
Culture: NO GROWTH
Culture: NO GROWTH
Special Requests: ADEQUATE
Special Requests: ADEQUATE

## 2023-10-27 ENCOUNTER — Encounter

## 2023-11-05 ENCOUNTER — Ambulatory Visit

## 2024-01-02 ENCOUNTER — Inpatient Hospital Stay: Payer: 59 | Admitting: Nurse Practitioner

## 2024-01-02 ENCOUNTER — Inpatient Hospital Stay: Payer: 59

## 2024-01-26 DIAGNOSIS — D75839 Thrombocytosis, unspecified: Secondary | ICD-10-CM

## 2024-01-27 ENCOUNTER — Inpatient Hospital Stay: Attending: Oncology

## 2024-01-27 ENCOUNTER — Encounter: Payer: Self-pay | Admitting: Nurse Practitioner

## 2024-01-27 ENCOUNTER — Inpatient Hospital Stay: Admitting: Nurse Practitioner

## 2024-01-27 VITALS — BP 135/76 | HR 87 | Temp 97.9°F | Resp 18 | Wt 209.8 lb

## 2024-01-27 DIAGNOSIS — D72828 Other elevated white blood cell count: Secondary | ICD-10-CM | POA: Diagnosis not present

## 2024-01-27 DIAGNOSIS — D75839 Thrombocytosis, unspecified: Secondary | ICD-10-CM | POA: Diagnosis not present

## 2024-01-27 LAB — CBC WITH DIFFERENTIAL/PLATELET
Abs Immature Granulocytes: 0.04 K/uL (ref 0.00–0.07)
Basophils Absolute: 0.1 K/uL (ref 0.0–0.1)
Basophils Relative: 1 %
Eosinophils Absolute: 0.9 K/uL — ABNORMAL HIGH (ref 0.0–0.5)
Eosinophils Relative: 8 %
HCT: 40.8 % (ref 36.0–46.0)
Hemoglobin: 12.9 g/dL (ref 12.0–15.0)
Immature Granulocytes: 0 %
Lymphocytes Relative: 28 %
Lymphs Abs: 3.3 K/uL (ref 0.7–4.0)
MCH: 27.4 pg (ref 26.0–34.0)
MCHC: 31.6 g/dL (ref 30.0–36.0)
MCV: 86.6 fL (ref 80.0–100.0)
Monocytes Absolute: 0.7 K/uL (ref 0.1–1.0)
Monocytes Relative: 6 %
Neutro Abs: 6.6 K/uL (ref 1.7–7.7)
Neutrophils Relative %: 57 %
Platelets: 365 K/uL (ref 150–400)
RBC: 4.71 MIL/uL (ref 3.87–5.11)
RDW: 12.8 % (ref 11.5–15.5)
WBC: 11.6 K/uL — ABNORMAL HIGH (ref 4.0–10.5)
nRBC: 0 % (ref 0.0–0.2)

## 2024-01-27 NOTE — Progress Notes (Unsigned)
 "    Hematology/Oncology Consult note [facility]  [phone] [phone]  Patient Care Team: Harvey Gaetana CROME, NP as PCP - General (Family Medicine) Melanee Annah BROCKS, MD as Consulting Physician (Oncology)   Name of the patient: Deborah Montgomery  [id number]  October 26, 1950   Date of visit: 01/28/2024  Diagnosis-neutrophilia and thrombocytosis likely reactive  Chief complaint/ Reason for visit-discuss results of blood work  Heme/Onc history: patient is a 74 year old female with a past medical history significant for hypertension hyperlipidemia anxiety COPD among other medical problems .  She has been referred to us  for leukocytosis.  White cell count on 12/03/2022 was 17.9 with an H&H of 13.8/41.5 and a platelet count of 477.  Looking back at her prior CBCs her white count has been fluctuating between 12-17 over the last 1 year and differential mainly shows neutrophilia.  She has had chronic leukocytosis/neutrophilia at least dating back to 2017 without a clear rising trend.  Platelet counts have been in the 400s since April 2024  Result of blood work from 12/09/2022 were as follows: CBC showed white count of 18.4 with an ANC of 14, H&H of 13.8/42.6 with a platelet count of 454.  Low cytometry showed no immunophenotypic abnormalities.  BCR-ABL FISH testing negative.  JAK2, CALR, MPL testing negative.    Interval history-patient reports having multiple asthma and copd exacerbations last year.  She is now seeing pulmonology and she is on a maintenance regimen with trelegy inhaler and dupixent, she has not had any copd/asthma exacerbations in several months.  Reports feeling well.  She did have a fall a few weeks ago resulting in a wound on her right lower leg.  PCP following her for this and she is healing well.  No other concerns at this time.  ECOG PS- 1 Pain scale- 0   Review of systems- Review of Systems  Constitutional:  Negative for chills, fever,  malaise/fatigue and weight loss.  HENT:  Negative for congestion, ear discharge and nosebleeds.   Eyes:  Negative for blurred vision.  Respiratory:  Negative for cough, hemoptysis, sputum production, shortness of breath and wheezing.   Cardiovascular:  Negative for chest pain, palpitations, orthopnea and claudication.  Gastrointestinal:  Negative for abdominal pain, blood in stool, constipation, diarrhea, heartburn, melena, nausea and vomiting.  Genitourinary:  Negative for dysuria, flank pain, frequency, hematuria and urgency.  Musculoskeletal:  Negative for back pain, joint pain and myalgias.  Skin:  Negative for rash.  Neurological:  Negative for dizziness, tingling, focal weakness, seizures, weakness and headaches.  Endo/Heme/Allergies:  Does not bruise/bleed easily.  Psychiatric/Behavioral:  Negative for depression and suicidal ideas. The patient does not have insomnia.       No Known Allergies   Past Medical History:  Diagnosis Date   Anxiety    Arthritis    Asthma    COPD (chronic obstructive pulmonary disease) (HCC)    a. diagnosed 05/2015   Essential hypertension    Eye problem    GERD (gastroesophageal reflux disease)    HLD (hyperlipidemia)    a. intolerant to simvastatin    Tobacco abuse      Past Surgical History:  Procedure Laterality Date   ABDOMINAL HYSTERECTOMY     CARDIAC CATHETERIZATION N/A 06/30/2015   Procedure: Left Heart Cath and Coronary Angiography;  Surgeon: Deatrice DELENA Cage, MD;  Location: [facility] INVASIVE CV LAB;  Service: Cardiovascular;  Laterality: N/A;   CARPAL TUNNEL RELEASE      Social History  Socioeconomic History   Marital status: Widowed    Spouse name: Not on file   Number of children: Not on file   Years of education: Not on file   Highest education level: Not on file  Occupational History   Not on file  Tobacco Use   Smoking status: Some Days    Types: Cigarettes   Smokeless tobacco: Not on file  Vaping Use   Vaping status:  Never Used  Substance and Sexual Activity   Alcohol use: No   Drug use: No   Sexual activity: Not Currently  Other Topics Concern   Not on file  Social History Narrative   Not on file   Social Drivers of Health   Tobacco Use: High Risk (01/27/2024)   Patient History    Smoking Tobacco Use: Some Days    Smokeless Tobacco Use: Unknown    Passive Exposure: Not on file  Financial Resource Strain: Low Risk  (12/15/2023)   Received from [facility]   Overall Financial Resource Strain (CARDIA)    Difficulty of Paying Living Expenses: Not hard at all  Food Insecurity: No Food Insecurity (12/15/2023)   Received from [facility]   Epic    Within the past 12 months, you worried that your food would run out before you got the money to buy more.: Never true    Within the past 12 months, the food you bought just didn't last and you didn't have money to get more.: Never true  Transportation Needs: No Transportation Needs (12/15/2023)   Received from [facility] - Transportation    In the past 12 months, has lack of transportation kept you from medical appointments or from getting medications?: No    Lack of Transportation (Non-Medical): No  Physical Activity: Not on file  Stress: Not on file  Social Connections: Moderately Isolated (09/12/2023)   Social Connection and Isolation Panel    Frequency of Communication with Friends and Family: More than three times a week    Frequency of Social Gatherings with Friends and Family: More than three times a week    Attends Religious Services: Patient declined    Database Administrator or Organizations: Yes    Attends Banker Meetings: More than 4 times per year    Marital Status: Widowed  Intimate Partner Violence: Not At Risk (09/12/2023)   Epic    Fear of Current or Ex-Partner: No    Emotionally Abused: No    Physically Abused: No    Sexually Abused: No  Depression  (PHQ2-9): Low Risk (01/27/2024)   Depression (PHQ2-9)    PHQ-2 Score: 0  Alcohol Screen: Not on file  Housing: Low Risk  (12/15/2023)   Received from [facility]   Epic    In the last 12 months, was there a time when you were not able to pay the mortgage or rent on time?: No    In the past 12 months, how many times have you moved where you were living?: 0    At any time in the past 12 months, were you homeless or living in a shelter (including now)?: No  Utilities: Not At Risk (12/15/2023)   Received from [facility]   Epic    In the past 12 months has the electric, gas, oil, or water company threatened to shut off services in your home?: No  Health Literacy: Not on file  Family History  Problem Relation Age of Onset   Lung cancer Brother    Breast cancer Paternal Aunt    Breast cancer Paternal Aunt      Current Outpatient Medications:    albuterol  (VENTOLIN  HFA) 108 (90 Base) MCG/ACT inhaler, Inhale 2 puffs into the lungs every 6 (six) hours as needed for wheezing or shortness of breath., Disp: 8 g, Rfl: 2   alendronate (FOSAMAX) 70 MG tablet, Take 70 mg by mouth once a week., Disp: , Rfl:    amLODipine  (NORVASC ) 5 MG tablet, Take 10 mg by mouth daily., Disp: , Rfl:    busPIRone (BUSPAR) 5 MG tablet, Take 5 mg by mouth 2 (two) times daily., Disp: , Rfl:    DUPIXENT 300 MG/2ML SOAJ, Inject 300 mg into the skin., Disp: , Rfl:    escitalopram (LEXAPRO) 5 MG tablet, Take 5 mg by mouth., Disp: , Rfl:    ezetimibe  (ZETIA ) 10 MG tablet, Take 10 mg by mouth daily., Disp: , Rfl:    fluticasone  (FLONASE ) 50 MCG/ACT nasal spray, Place 2 sprays into both nostrils daily as needed for rhinitis., Disp: , Rfl:    ibuprofen (ADVIL) 800 MG tablet, Take 800 mg by mouth every 6 (six) hours as needed., Disp: , Rfl:    latanoprost  (XALATAN ) 0.005 % ophthalmic solution, Place 1 drop into both eyes at bedtime., Disp: , Rfl:    losartan  (COZAAR ) 50 MG tablet, Take 100  mg by mouth daily., Disp: , Rfl:    meloxicam (MOBIC) 7.5 MG tablet, Take 7.5 mg by mouth daily., Disp: , Rfl:    montelukast  (SINGULAIR ) 10 MG tablet, Take 10 mg by mouth daily., Disp: , Rfl:    TRELEGY ELLIPTA 100-62.5-25 MCG/ACT AEPB, Take 1 puff by mouth daily., Disp: , Rfl:    Vitamin D, Ergocalciferol, (DRISDOL) 1.25 MG (50000 UNIT) CAPS capsule, Take 50,000 Units by mouth once a week., Disp: , Rfl:    albuterol  (PROVENTIL ) (2.5 MG/3ML) 0.083% nebulizer solution, Take 3 mLs (2.5 mg total) by nebulization every 4 (four) hours as needed for wheezing or shortness of breath., Disp: 75 mL, Rfl: 0   BREZTRI AEROSPHERE 160-9-4.8 MCG/ACT AERO inhaler, Inhale 2 puffs into the lungs 2 (two) times daily. (Patient not taking: Reported on 01/27/2024), Disp: , Rfl:    DULERA  200-5 MCG/ACT AERO, Inhale 2 puffs into the lungs 2 (two) times daily., Disp: , Rfl:    omeprazole (PRILOSEC) 40 MG capsule, Take 40 mg by mouth daily., Disp: , Rfl:    Spacer/Aero-Holding Chambers (AEROCHAMBER MV) inhaler, Use as instructed, Disp: 1 each, Rfl: 0   tiotropium (SPIRIVA ) 18 MCG inhalation capsule, Place 18 mcg into inhaler and inhale daily., Disp: , Rfl:   Physical exam:  Vitals:   01/27/24 1318  BP: 135/76  Pulse: 87  Resp: 18  Temp: 97.9 F (36.6 C)  TempSrc: Tympanic  SpO2: 100%  Weight: 209 lb 12.8 oz (95.2 kg)   Physical Exam Cardiovascular:     Rate and Rhythm: Normal rate and regular rhythm.     Heart sounds: Normal heart sounds.  Pulmonary:     Effort: Pulmonary effort is normal.     Breath sounds: Normal breath sounds.  Abdominal:     General: Bowel sounds are normal.     Palpations: Abdomen is soft.  Skin:    General: Skin is warm and dry.  Neurological:     Mental Status: She is alert and oriented to person, place, and time.  Latest Ref Rng & Units 09/11/2023   10:25 AM  CMP  Glucose 70 - 99 mg/dL 875   BUN 8 - 23 mg/dL 18   Creatinine 9.55 - 1.00 mg/dL 9.37   Sodium 864 - 854  mmol/L 136   Potassium 3.5 - 5.1 mmol/L 4.0   Chloride 98 - 111 mmol/L 101   CO2 22 - 32 mmol/L 23   Calcium  8.9 - 10.3 mg/dL 9.2       Latest Ref Rng & Units 01/27/2024   12:42 PM  CBC  WBC 4.0 - 10.5 K/uL 11.6   Hemoglobin 12.0 - 15.0 g/dL 87.0   Hematocrit 63.9 - 46.0 % 40.8   Platelets 150 - 400 K/uL 365      Assessment and plan- Patient is a 74 y.o. female referred for neutrophilia and thrombocytosis likely reactive  Discussed the results of blood work including BCR-ABL FISH, flow cytometry, JAK2, CALR and MPL testing which were all unremarkable.  Her leukocytosis/neutrophilia and thrombocytosis are therefore likely reactive and not secondary to an underlying bone marrow disorder.  She does not require any intervention for this.  Patient overall doing well with better control of COPD and Asthma.  WBC improved this visit to 11.6.  No s/sx of infections.  Platelets WNL 365.    Will continue to monitor at this time no intervention needed.   Follow up plan:  F/U in 6 mths see MD with cbc with diff LP      Morna Husband AGNP-C [facility] [phone] 01/28/2024 9:11 AM                "

## 2024-01-27 NOTE — Progress Notes (Unsigned)
 Pt reports she fell a few weeks ago, only one time, hurt her right lower leg and knee. Pt currently taking meloxicam.

## 2024-07-26 ENCOUNTER — Inpatient Hospital Stay

## 2024-07-26 ENCOUNTER — Inpatient Hospital Stay: Admitting: Oncology
